# Patient Record
Sex: Male | Born: 1955 | Race: White | Hispanic: No | Marital: Married | Smoking: Former smoker
Health system: Southern US, Community
[De-identification: ages and names within clinical notes are randomized; demographics above are authoritative.]

## PROBLEM LIST (undated history)

## (undated) DIAGNOSIS — T7840XA Allergy, unspecified, initial encounter: Secondary | ICD-10-CM

## (undated) DIAGNOSIS — N400 Enlarged prostate without lower urinary tract symptoms: Secondary | ICD-10-CM

## (undated) HISTORY — DX: Allergy, unspecified, initial encounter: T78.40XA

## (undated) HISTORY — PX: NASAL SEPTUM SURGERY: SHX37

## (undated) HISTORY — DX: Benign prostatic hyperplasia without lower urinary tract symptoms: N40.0

---

## 1999-10-14 ENCOUNTER — Encounter: Payer: Self-pay | Admitting: Internal Medicine

## 1999-10-14 ENCOUNTER — Ambulatory Visit (HOSPITAL_COMMUNITY): Admission: RE | Admit: 1999-10-14 | Discharge: 1999-10-14 | Payer: Self-pay | Admitting: Internal Medicine

## 1999-11-18 ENCOUNTER — Other Ambulatory Visit: Admission: RE | Admit: 1999-11-18 | Discharge: 1999-11-18 | Payer: Self-pay | Admitting: Gastroenterology

## 1999-11-18 ENCOUNTER — Encounter (INDEPENDENT_AMBULATORY_CARE_PROVIDER_SITE_OTHER): Payer: Self-pay | Admitting: Specialist

## 2004-03-04 ENCOUNTER — Ambulatory Visit: Payer: Self-pay | Admitting: Internal Medicine

## 2004-03-12 ENCOUNTER — Ambulatory Visit: Payer: Self-pay | Admitting: Internal Medicine

## 2005-06-02 ENCOUNTER — Ambulatory Visit: Payer: Self-pay | Admitting: Internal Medicine

## 2007-01-17 ENCOUNTER — Ambulatory Visit: Payer: Self-pay | Admitting: Internal Medicine

## 2007-01-17 LAB — CONVERTED CEMR LAB
ALT: 25 units/L (ref 0–53)
AST: 21 units/L (ref 0–37)
Albumin: 3.9 g/dL (ref 3.5–5.2)
Alkaline Phosphatase: 53 units/L (ref 39–117)
BUN: 12 mg/dL (ref 6–23)
Basophils Absolute: 0 10*3/uL (ref 0.0–0.1)
Basophils Relative: 0.4 % (ref 0.0–1.0)
Bilirubin Urine: NEGATIVE
Bilirubin, Direct: 0.2 mg/dL (ref 0.0–0.3)
CO2: 31 meq/L (ref 19–32)
Calcium: 9.3 mg/dL (ref 8.4–10.5)
Chloride: 105 meq/L (ref 96–112)
Cholesterol: 169 mg/dL (ref 0–200)
Creatinine, Ser: 1 mg/dL (ref 0.4–1.5)
Eosinophils Absolute: 0.2 10*3/uL (ref 0.0–0.6)
Eosinophils Relative: 2.5 % (ref 0.0–5.0)
GFR calc Af Amer: 102 mL/min
GFR calc non Af Amer: 84 mL/min
Glucose, Bld: 94 mg/dL (ref 70–99)
Glucose, Urine, Semiquant: NEGATIVE
HCT: 41 % (ref 39.0–52.0)
HDL: 29.3 mg/dL — ABNORMAL LOW (ref >39.0)
Hemoglobin: 14.5 g/dL (ref 13.0–17.0)
Ketones, urine, test strip: NEGATIVE
LDL Cholesterol: 112 mg/dL — ABNORMAL HIGH (ref 0–99)
Lymphocytes Relative: 23.3 % (ref 12.0–46.0)
MCHC: 35.3 g/dL (ref 30.0–36.0)
MCV: 93.3 fL (ref 78.0–100.0)
Monocytes Absolute: 0.4 10*3/uL (ref 0.2–0.7)
Monocytes Relative: 6.2 % (ref 3.0–11.0)
Neutro Abs: 4.8 10*3/uL (ref 1.4–7.7)
Neutrophils Relative %: 67.6 % (ref 43.0–77.0)
Nitrite: NEGATIVE
PSA: 0.35 ng/mL (ref 0.10–4.00)
Platelets: 206 10*3/uL (ref 150–400)
Potassium: 5 meq/L (ref 3.5–5.1)
Protein, U semiquant: NEGATIVE
RBC: 4.39 M/uL (ref 4.22–5.81)
RDW: 11.7 % (ref 11.5–14.6)
Sodium: 141 meq/L (ref 135–145)
Specific Gravity, Urine: 1.025
TSH: 1.19 microintl units/mL (ref 0.35–5.50)
Total Bilirubin: 0.8 mg/dL (ref 0.3–1.2)
Total CHOL/HDL Ratio: 5.8
Total Protein: 6.4 g/dL (ref 6.0–8.3)
Triglycerides: 140 mg/dL (ref 0–149)
Urobilinogen, UA: 0.2
VLDL: 28 mg/dL (ref 0–40)
WBC Urine, dipstick: NEGATIVE
WBC: 7.1 10*3/uL (ref 4.5–10.5)
pH: 5.5

## 2007-02-08 ENCOUNTER — Ambulatory Visit: Payer: Self-pay | Admitting: Internal Medicine

## 2007-03-08 ENCOUNTER — Telehealth: Payer: Self-pay | Admitting: Internal Medicine

## 2007-11-13 ENCOUNTER — Ambulatory Visit: Payer: Self-pay | Admitting: Gastroenterology

## 2007-11-30 ENCOUNTER — Ambulatory Visit: Payer: Self-pay | Admitting: Gastroenterology

## 2007-11-30 ENCOUNTER — Encounter: Payer: Self-pay | Admitting: Gastroenterology

## 2007-11-30 HISTORY — PX: COLONOSCOPY: SHX174

## 2007-12-03 ENCOUNTER — Encounter: Payer: Self-pay | Admitting: Gastroenterology

## 2009-03-05 ENCOUNTER — Ambulatory Visit: Payer: Self-pay | Admitting: Internal Medicine

## 2009-03-05 LAB — CONVERTED CEMR LAB
ALT: 29 units/L (ref 0–53)
AST: 26 units/L (ref 0–37)
Albumin: 4.3 g/dL (ref 3.5–5.2)
Alkaline Phosphatase: 57 units/L (ref 39–117)
BUN: 14 mg/dL (ref 6–23)
Basophils Absolute: 0 10*3/uL (ref 0.0–0.1)
Basophils Relative: 0.8 % (ref 0.0–3.0)
Bilirubin Urine: NEGATIVE
Bilirubin, Direct: 0.2 mg/dL (ref 0.0–0.3)
CO2: 30 meq/L (ref 19–32)
Calcium: 9.5 mg/dL (ref 8.4–10.5)
Chloride: 108 meq/L (ref 96–112)
Cholesterol: 189 mg/dL (ref 0–200)
Creatinine, Ser: 1 mg/dL (ref 0.4–1.5)
Eosinophils Absolute: 0.1 10*3/uL (ref 0.0–0.7)
Eosinophils Relative: 2.9 % (ref 0.0–5.0)
GFR calc non Af Amer: 83.1 mL/min (ref >60)
Glucose, Bld: 96 mg/dL (ref 70–99)
HCT: 42.9 % (ref 39.0–52.0)
HDL: 40.4 mg/dL (ref >39.00)
Hemoglobin: 14.2 g/dL (ref 13.0–17.0)
Ketones, ur: NEGATIVE mg/dL
LDL Cholesterol: 121 mg/dL — ABNORMAL HIGH (ref 0–99)
Leukocytes, UA: NEGATIVE
Lymphocytes Relative: 28.5 % (ref 12.0–46.0)
Lymphs Abs: 1.2 10*3/uL (ref 0.7–4.0)
MCHC: 33.1 g/dL (ref 30.0–36.0)
MCV: 98.3 fL (ref 78.0–100.0)
Monocytes Absolute: 0.3 10*3/uL (ref 0.1–1.0)
Monocytes Relative: 7.5 % (ref 3.0–12.0)
Neutro Abs: 2.7 10*3/uL (ref 1.4–7.7)
Neutrophils Relative %: 60.3 % (ref 43.0–77.0)
Nitrite: NEGATIVE
PSA: 0.51 ng/mL (ref 0.10–4.00)
Platelets: 194 10*3/uL (ref 150.0–400.0)
Potassium: 4.9 meq/L (ref 3.5–5.1)
RBC: 4.37 M/uL (ref 4.22–5.81)
RDW: 12 % (ref 11.5–14.6)
Sodium: 143 meq/L (ref 135–145)
Specific Gravity, Urine: 1.02 (ref 1.000–1.030)
TSH: 1.56 microintl units/mL (ref 0.35–5.50)
Total Bilirubin: 0.8 mg/dL (ref 0.3–1.2)
Total CHOL/HDL Ratio: 5
Total Protein, Urine: NEGATIVE mg/dL
Total Protein: 7.2 g/dL (ref 6.0–8.3)
Triglycerides: 139 mg/dL (ref 0.0–149.0)
Urine Glucose: NEGATIVE mg/dL
Urobilinogen, UA: 0.2 (ref 0.0–1.0)
VLDL: 27.8 mg/dL (ref 0.0–40.0)
WBC: 4.3 10*3/uL — ABNORMAL LOW (ref 4.5–10.5)
pH: 6 (ref 5.0–8.0)

## 2009-03-18 ENCOUNTER — Ambulatory Visit: Payer: Self-pay | Admitting: Internal Medicine

## 2010-03-02 NOTE — Assessment & Plan Note (Signed)
Summary: cpx/cjr   Vital Signs:  Patient profile:   55 year old male Height:      70 inches Weight:      194 pounds BMI:     27.94 Pulse rate:   60 / minute Resp:     12 per minute BP sitting:   118 / 82  (left arm)  Vitals Entered By: Gladis Riffle, RN (March 18, 2009 8:08 AM)  Nutrition Counseling: Patient's BMI is greater than 25 and therefore counseled on weight management options. CC: cpx, labs done Is Patient Diabetic? No   CC:  cpx and labs done.  History of Present Illness: cpx  Preventive Screening-Counseling & Management  Alcohol-Tobacco     Smoking Status: quit > 6 months     Year Quit: 2003  Caffeine-Diet-Exercise     Does Patient Exercise: yes     Type of exercise: aerobic  Current Problems (verified): 1)  Physical Examination  (ICD-V70.0)  Current Medications (verified): 1)  Ots Sleep Aid .... At Bedtime 2)  Advil 200 Mg Tabs (Ibuprofen) .... As Needed  Allergies (verified): No Known Drug Allergies  Social History: Smoking Status:  quit > 6 months Does Patient Exercise:  yes  Review of Systems       All other systems reviewed and were negative   Physical Exam  General:  alert and well-developed.   Head:  normocephalic and atraumatic.   Eyes:  pupils equal and pupils round.   Ears:  R ear normal and L ear normal.   Neck:  No deformities, masses, or tenderness noted. Chest Wall:  No deformities, masses, tenderness or gynecomastia noted. Lungs:  Normal respiratory effort, chest expands symmetrically. Lungs are clear to auscultation, no crackles or wheezes. Heart:  Normal rate and regular rhythm. S1 and S2 normal without gallop, murmur, click, rub or other extra sounds. Abdomen:  Bowel sounds positive,abdomen soft and non-tender without masses, organomegaly or hernias noted. Rectal:  no external abnormalities and normal sphincter tone.   Prostate:  no gland enlargement and no asymmetry.   Msk:  No deformity or scoliosis noted of thoracic or  lumbar spine.   Pulses:  R radial normal and L radial normal.   Neurologic:  cranial nerves II-XII intact and gait normal.     Impression & Recommendations:  Problem # 1:  PHYSICAL EXAMINATION (ICD-V70.0) health maint UTD advised daily exercise low fat diet  Complete Medication List: 1)  Ots Sleep Aid  .... At bedtime 2)  Advil 200 Mg Tabs (Ibuprofen) .... As needed

## 2010-09-29 ENCOUNTER — Telehealth: Payer: Self-pay | Admitting: Internal Medicine

## 2010-09-29 DIAGNOSIS — Z0000 Encounter for general adult medical examination without abnormal findings: Secondary | ICD-10-CM

## 2010-09-29 NOTE — Telephone Encounter (Signed)
Pt called and has sch cpx labs at [facility]. Need to get lab order for that location.

## 2010-09-29 NOTE — Telephone Encounter (Signed)
Future orders placed 

## 2010-11-22 ENCOUNTER — Other Ambulatory Visit: Payer: Self-pay

## 2010-11-22 DIAGNOSIS — Z0000 Encounter for general adult medical examination without abnormal findings: Secondary | ICD-10-CM

## 2010-11-22 LAB — CBC WITH DIFFERENTIAL/PLATELET
Basophils Absolute: 0 10*3/uL (ref 0.0–0.1)
Eosinophils Relative: 3 % (ref 0.0–5.0)
HCT: 42 % (ref 39.0–52.0)
Lymphs Abs: 1.4 10*3/uL (ref 0.7–4.0)
Monocytes Relative: 6.4 % (ref 3.0–12.0)
Neutrophils Relative %: 64.6 % (ref 43.0–77.0)
Platelets: 200 10*3/uL (ref 150.0–400.0)
RDW: 12.7 % (ref 11.5–14.6)
WBC: 5.4 10*3/uL (ref 4.5–10.5)

## 2010-11-22 LAB — HEPATIC FUNCTION PANEL
ALT: 18 U/L (ref 0–53)
AST: 20 U/L (ref 0–37)
Albumin: 4.4 g/dL (ref 3.5–5.2)
Alkaline Phosphatase: 53 U/L (ref 39–117)
Total Protein: 7 g/dL (ref 6.0–8.3)

## 2010-11-22 LAB — BASIC METABOLIC PANEL
BUN: 19 mg/dL (ref 6–23)
Calcium: 8.9 mg/dL (ref 8.4–10.5)
Chloride: 104 mEq/L (ref 96–112)
Creatinine, Ser: 1 mg/dL (ref 0.4–1.5)
GFR: 84.52 mL/min (ref >60.00)

## 2010-11-22 LAB — LIPID PANEL
Cholesterol: 191 mg/dL (ref 0–200)
HDL: 42.8 mg/dL (ref >39.00)
Total CHOL/HDL Ratio: 4
Triglycerides: 142 mg/dL (ref 0.0–149.0)

## 2010-11-22 LAB — TSH: TSH: 1.66 u[IU]/mL (ref 0.35–5.50)

## 2010-11-22 LAB — PSA: PSA: 1.53 ng/mL (ref 0.10–4.00)

## 2010-11-29 ENCOUNTER — Ambulatory Visit (INDEPENDENT_AMBULATORY_CARE_PROVIDER_SITE_OTHER): Payer: BC Managed Care – PPO | Admitting: Internal Medicine

## 2010-11-29 ENCOUNTER — Encounter: Payer: Self-pay | Admitting: Internal Medicine

## 2010-11-29 VITALS — BP 126/84 | HR 68 | Temp 98.1°F | Ht 70.5 in | Wt 189.0 lb

## 2010-11-29 DIAGNOSIS — Z23 Encounter for immunization: Secondary | ICD-10-CM

## 2010-11-29 DIAGNOSIS — R972 Elevated prostate specific antigen [PSA]: Secondary | ICD-10-CM

## 2010-11-29 DIAGNOSIS — Z0000 Encounter for general adult medical examination without abnormal findings: Secondary | ICD-10-CM

## 2010-11-29 LAB — POCT URINALYSIS DIPSTICK
Glucose, UA: NEGATIVE
Ketones, UA: NEGATIVE
Leukocytes, UA: NEGATIVE
Spec Grav, UA: 1.025
Urobilinogen, UA: 0.2

## 2010-11-29 NOTE — Progress Notes (Signed)
  Subjective:    Patient ID: Todd [name], male    DOB: December 12, 1955, 55 y.o.   MRN: [id number]  HPI cpx  Past Medical History  Diagnosis Date  . Allergy   . BPH (benign prostatic hyperplasia)    Past Surgical History  Procedure Date  . Nasal septum surgery     reports that he quit smoking about 9 years ago. He does not have any smokeless tobacco history on file. He reports that he drinks about .6 ounces of alcohol per week. His drug history not on file. family history includes Cancer (age of onset:77) in his father. No Known Allergies   Review of Systems  patient denies chest pain, shortness of breath, orthopnea. Denies lower extremity edema, abdominal pain, change in appetite, change in bowel movements. Patient denies rashes, musculoskeletal complaints. No other specific complaints in a complete review of systems.      Objective:   Physical Exam Well-developed male in no acute distress. HEENT exam atraumatic, normocephalic, extraocular muscles are intact. Conjunctivae are pink without exudate. Neck is supple without lymphadenopathy, thyromegaly, jugular venous distention. Chest is clear to auscultation without increased work of breathing. Cardiac exam S1-S2 are regular. The PMI is normal. No significant murmurs or gallops. Abdominal exam active bowel sounds, soft, nontender. No abdominal bruits. Extremities no clubbing cyanosis or edema. Peripheral pulses are normal without bruits. Neurologic exam alert and oriented without any motor or sensory deficits. Rectal exam normal tone prostate normal size without masses or asymmetry.        Assessment & Plan:  Well visit. Health maintenance up-to-date.

## 2011-01-04 ENCOUNTER — Other Ambulatory Visit (INDEPENDENT_AMBULATORY_CARE_PROVIDER_SITE_OTHER): Payer: BC Managed Care – PPO

## 2011-01-04 DIAGNOSIS — IMO0001 Reserved for inherently not codable concepts without codable children: Secondary | ICD-10-CM

## 2011-01-04 LAB — POCT URINALYSIS DIPSTICK
Ketones, UA: NEGATIVE
Leukocytes, UA: NEGATIVE
Nitrite, UA: NEGATIVE
Protein, UA: NEGATIVE
Urobilinogen, UA: 0.2

## 2011-01-04 LAB — MICROALBUMIN / CREATININE URINE RATIO: Microalb, Ur: 0.2 mg/dL (ref 0.0–1.9)

## 2011-01-13 ENCOUNTER — Telehealth: Payer: Self-pay | Admitting: Internal Medicine

## 2011-01-13 NOTE — Telephone Encounter (Signed)
Pt need ua results

## 2011-01-14 NOTE — Telephone Encounter (Signed)
Pt aware of normal u/a

## 2011-05-30 ENCOUNTER — Other Ambulatory Visit (INDEPENDENT_AMBULATORY_CARE_PROVIDER_SITE_OTHER): Payer: BC Managed Care – PPO

## 2011-05-30 DIAGNOSIS — R972 Elevated prostate specific antigen [PSA]: Secondary | ICD-10-CM

## 2011-05-30 LAB — PSA: PSA: 0.43 ng/mL (ref 0.10–4.00)

## 2012-09-13 ENCOUNTER — Encounter: Payer: Self-pay | Admitting: Gastroenterology

## 2012-09-17 ENCOUNTER — Encounter: Payer: Self-pay | Admitting: Gastroenterology

## 2012-11-27 ENCOUNTER — Encounter: Payer: Self-pay | Admitting: Gastroenterology

## 2012-11-27 ENCOUNTER — Ambulatory Visit (AMBULATORY_SURGERY_CENTER): Payer: Self-pay | Admitting: *Deleted

## 2012-11-27 VITALS — Ht 70.0 in | Wt 190.4 lb

## 2012-11-27 DIAGNOSIS — Z86010 Personal history of colonic polyps: Secondary | ICD-10-CM

## 2012-11-27 MED ORDER — SOD PICOSULFATE-MAG OX-CIT ACD 10-3.5-12 MG-GM-GM PO PACK: 1.0000 | PACK | Freq: Once | ORAL | Status: DC

## 2012-11-27 NOTE — Progress Notes (Signed)
Denies allergies to eggs or soy products. Denies complications with sedation or anesthesia. 

## 2012-12-11 ENCOUNTER — Encounter: Payer: Self-pay | Admitting: Gastroenterology

## 2012-12-11 ENCOUNTER — Encounter: Payer: BC Managed Care – PPO | Admitting: Gastroenterology

## 2012-12-11 ENCOUNTER — Ambulatory Visit (AMBULATORY_SURGERY_CENTER): Payer: BC Managed Care – PPO | Admitting: Gastroenterology

## 2012-12-11 VITALS — BP 127/66 | HR 51 | Temp 97.3°F | Resp 17 | Ht 70.0 in | Wt 190.0 lb

## 2012-12-11 DIAGNOSIS — Z86010 Personal history of colonic polyps: Secondary | ICD-10-CM

## 2012-12-11 DIAGNOSIS — Z800 Family history of malignant neoplasm of digestive organs: Secondary | ICD-10-CM

## 2012-12-11 MED ORDER — SODIUM CHLORIDE 0.9 % IV SOLN: 500.0000 mL | INTRAVENOUS | Status: DC

## 2012-12-11 NOTE — Patient Instructions (Signed)
YOU HAD AN ENDOSCOPIC PROCEDURE TODAY AT THE [facility]: Refer to the procedure report that was given to you for any specific questions about what was found during the examination.  If the procedure report does not answer your questions, please call your gastroenterologist to clarify.  If you requested that your care partner not be given the details of your procedure findings, then the procedure report has been included in a sealed envelope for you to review at your convenience later.  YOU SHOULD EXPECT: Some feelings of bloating in the abdomen. Passage of more gas than usual.  Walking can help get rid of the air that was put into your GI tract during the procedure and reduce the bloating. If you had a lower endoscopy (such as a colonoscopy or flexible sigmoidoscopy) you may notice spotting of blood in your stool or on the toilet paper. If you underwent a bowel prep for your procedure, then you may not have a normal bowel movement for a few days.  DIET: Your first meal following the procedure should be a light meal and then it is ok to progress to your normal diet.  A half-sandwich or bowl of soup is an example of a good first meal.  Heavy or fried foods are harder to digest and may make you feel nauseous or bloated.  Likewise meals heavy in dairy and vegetables can cause extra gas to form and this can also increase the bloating.  Drink plenty of fluids but you should avoid alcoholic beverages for 24 hours.  ACTIVITY: Your care partner should take you home directly after the procedure.  You should plan to take it easy, moving slowly for the rest of the day.  You can resume normal activity the day after the procedure however you should NOT DRIVE or use heavy machinery for 24 hours (because of the sedation medicines used during the test).    SYMPTOMS TO REPORT IMMEDIATELY: A gastroenterologist can be reached at any hour.  During normal business hours, 8:30 AM to 5:00 PM Monday through Friday,  call [phone].  After hours and on weekends, please call the GI answering service at [phone] who will take a message and have the physician on call contact you.   Following lower endoscopy (colonoscopy or flexible sigmoidoscopy):  Excessive amounts of blood in the stool  Significant tenderness or worsening of abdominal pains  Swelling of the abdomen that is new, acute  Fever of 100F or higher    FOLLOW UP: If any biopsies were taken you will be contacted by phone or by letter within the next 1-3 weeks.  Call your gastroenterologist if you have not heard about the biopsies in 3 weeks.  Our staff will call the home number listed on your records the next business day following your procedure to check on you and address any questions or concerns that you may have at that time regarding the information given to you following your procedure. This is a courtesy call and so if there is no answer at the home number and we have not heard from you through the emergency physician on call, we will assume that you have returned to your regular daily activities without incident.  SIGNATURES/CONFIDENTIALITY: You and/or your care partner have signed paperwork which will be entered into your electronic medical record.  These signatures attest to the fact that that the information above on your After Visit Summary has been reviewed and is understood.  Full responsibility of the confidentiality   of this discharge information lies with you and/or your care-partner.   Hemorrhoid information given.  Repeat colonoscopy in 5 years.

## 2012-12-11 NOTE — Op Note (Signed)
[facility] [address] [place], [address]   COLONOSCOPY PROCEDURE REPORT  PATIENT: Todd, [name]  MR#: [id number] BIRTHDATE: 01-27-1956 , 57  yrs. old GENDER: Male ENDOSCOPIST: Meryl Dare, MD, [facility] PROCEDURE DATE:  12/11/2012 PROCEDURE:   Colonoscopy, screening First Screening Colonoscopy - Avg.  risk and is 50 yrs.  old or older - No.  Prior Negative Screening - Now for repeat screening. N/A  History of Adenoma - Now for follow-up colonoscopy & has been > or = to 3 yrs.  Yes hx of adenoma.  Has been 3 or more years since last colonoscopy.  Polyps Removed Today? No.  Recommend repeat exam, <10 yrs? Yes.  High risk (family or personal hx). ASA CLASS:   Class II INDICATIONS:Patient's personal history of adenomatous colon polyps and Patient's immediate family history of colon cancer. MEDICATIONS: MAC sedation, administered by CRNA and propofol (Diprivan) 300mg  IV DESCRIPTION OF PROCEDURE:   After the risks benefits and alternatives of the procedure were thoroughly explained, informed consent was obtained.  A digital rectal exam revealed no abnormalities of the rectum.   The LB [id number]  endoscope was introduced through the anus and advanced to the cecum, which was identified by both the appendix and ileocecal valve. No adverse events experienced.   The quality of the prep was Prepopik excellent  The instrument was then slowly withdrawn as the colon was fully examined.  COLON FINDINGS: A normal appearing cecum, ileocecal valve, and appendiceal orifice were identified.  The ascending, hepatic flexure, transverse, splenic flexure, descending, sigmoid colon and rectum appeared unremarkable.  No polyps or cancers were seen. Retroflexed views revealed small internal hemorrhoids. The time to cecum=2 minutes 20 seconds.  Withdrawal time=9 minutes 20 seconds. The scope was withdrawn and the procedure completed.  COMPLICATIONS: There were no  complications.  ENDOSCOPIC IMPRESSION: 1.  Normal colon 2.  Small internal hemorrhoids  RECOMMENDATIONS: 1.  Repeat Colonoscopy in 5 years.   eSigned:  Meryl Dare, MD, [facility] 12/11/2012 9:51 AM

## 2012-12-11 NOTE — Progress Notes (Signed)
Patient did not experience any of the following events: a burn prior to discharge; a fall within the facility; wrong site/side/patient/procedure/implant event; or a hospital transfer or hospital admission upon discharge from the facility. (G8907) Patient did not have preoperative order for IV antibiotic SSI prophylaxis. (G8918)  

## 2012-12-11 NOTE — Progress Notes (Signed)
Lidocaine-40mg IV prior to Propofol InductionPropofol given over incremental dosages 

## 2012-12-12 ENCOUNTER — Telehealth: Payer: Self-pay | Admitting: *Deleted

## 2012-12-12 NOTE — Telephone Encounter (Signed)
Message left

## 2013-05-17 ENCOUNTER — Other Ambulatory Visit (INDEPENDENT_AMBULATORY_CARE_PROVIDER_SITE_OTHER): Payer: BC Managed Care – PPO

## 2013-05-17 DIAGNOSIS — Z0000 Encounter for general adult medical examination without abnormal findings: Secondary | ICD-10-CM

## 2013-05-17 LAB — LIPID PANEL
Cholesterol: 185 mg/dL (ref 0–200)
HDL: 39.9 mg/dL (ref >39.00)
LDL CALC: 120 mg/dL — AB (ref 0–99)
TRIGLYCERIDES: 124 mg/dL (ref 0.0–149.0)
Total CHOL/HDL Ratio: 5
VLDL: 24.8 mg/dL (ref 0.0–40.0)

## 2013-05-17 LAB — CBC WITH DIFFERENTIAL/PLATELET
Basophils Absolute: 0 10*3/uL (ref 0.0–0.1)
Basophils Relative: 0.9 % (ref 0.0–3.0)
EOS ABS: 0.1 10*3/uL (ref 0.0–0.7)
Eosinophils Relative: 2.8 % (ref 0.0–5.0)
HCT: 41.3 % (ref 39.0–52.0)
Hemoglobin: 14.1 g/dL (ref 13.0–17.0)
Lymphocytes Relative: 26 % (ref 12.0–46.0)
Lymphs Abs: 1.4 10*3/uL (ref 0.7–4.0)
MCHC: 34.1 g/dL (ref 30.0–36.0)
MCV: 96.8 fl (ref 78.0–100.0)
Monocytes Absolute: 0.4 10*3/uL (ref 0.1–1.0)
Monocytes Relative: 7.8 % (ref 3.0–12.0)
NEUTROS PCT: 62.5 % (ref 43.0–77.0)
Neutro Abs: 3.3 10*3/uL (ref 1.4–7.7)
PLATELETS: 205 10*3/uL (ref 150.0–400.0)
RBC: 4.27 Mil/uL (ref 4.22–5.81)
RDW: 12.8 % (ref 11.5–14.6)
WBC: 5.3 10*3/uL (ref 4.5–10.5)

## 2013-05-17 LAB — POCT URINALYSIS DIPSTICK
Bilirubin, UA: NEGATIVE
Blood, UA: NEGATIVE
Glucose, UA: NEGATIVE
Ketones, UA: NEGATIVE
Leukocytes, UA: NEGATIVE
Nitrite, UA: NEGATIVE
Protein, UA: NEGATIVE
Spec Grav, UA: 1.015
Urobilinogen, UA: 0.2
pH, UA: 7

## 2013-05-17 LAB — HEPATIC FUNCTION PANEL
ALT: 25 U/L (ref 0–53)
AST: 22 U/L (ref 0–37)
Albumin: 4.2 g/dL (ref 3.5–5.2)
Alkaline Phosphatase: 54 U/L (ref 39–117)
BILIRUBIN DIRECT: 0.1 mg/dL (ref 0.0–0.3)
BILIRUBIN TOTAL: 1.3 mg/dL — AB (ref 0.3–1.2)
Total Protein: 6.8 g/dL (ref 6.0–8.3)

## 2013-05-17 LAB — PSA: PSA: 0.66 ng/mL (ref 0.10–4.00)

## 2013-05-17 LAB — BASIC METABOLIC PANEL
BUN: 16 mg/dL (ref 6–23)
CALCIUM: 9.6 mg/dL (ref 8.4–10.5)
CO2: 30 meq/L (ref 19–32)
CREATININE: 1 mg/dL (ref 0.4–1.5)
Chloride: 104 mEq/L (ref 96–112)
GFR: 85.77 mL/min (ref >60.00)
Glucose, Bld: 92 mg/dL (ref 70–99)
Potassium: 5.4 mEq/L — ABNORMAL HIGH (ref 3.5–5.1)
Sodium: 141 mEq/L (ref 135–145)

## 2013-05-17 LAB — TSH: TSH: 1.77 u[IU]/mL (ref 0.35–5.50)

## 2013-05-24 ENCOUNTER — Encounter: Payer: BC Managed Care – PPO | Admitting: Internal Medicine

## 2013-05-26 NOTE — Progress Notes (Signed)
cpx  Past Medical History  Diagnosis Date  . Allergy   . BPH (benign prostatic hyperplasia)     History   Social History  . Marital Status: Married    Spouse Name: N/A    Number of Children: N/A  . Years of Education: N/A   Occupational History  . Not on file.   Social History Main Topics  . Smoking status: Former Smoker    Quit date: 01/31/2001  . Smokeless tobacco: Never Used  . Alcohol Use: 6.0 oz/week    10 Cans of beer per week  . Drug Use: No  . Sexual Activity: Not on file   Other Topics Concern  . Not on file   Social History Narrative  . No narrative on file    Past Surgical History  Procedure Laterality Date  . Nasal septum surgery    . Colonoscopy  11/30/07    Family History  Problem Relation Age of Onset  . Cancer Father 19    cancer  . Colon cancer Father   . Colon cancer Paternal Grandmother   . Esophageal cancer Neg Hx   . Rectal cancer Neg Hx   . Stomach cancer Neg Hx     No Known Allergies  Current Outpatient Prescriptions on File Prior to Visit  Medication Sig Dispense Refill  . ibuprofen (ADVIL,MOTRIN) 200 MG tablet Take 200 mg by mouth every 6 (six) hours as needed.        . Multiple Vitamins-Minerals (CENTRUM SILVER ULTRA MENS PO) Take 1 tablet by mouth daily.       No current facility-administered medications on file prior to visit.     patient denies chest pain, shortness of breath, orthopnea. Denies lower extremity edema, abdominal pain, change in appetite, change in bowel movements. Patient denies rashes, musculoskeletal complaints. No other specific complaints in a complete review of systems.   Reviewed vitals Well-developed male in no acute distress. HEENT exam atraumatic, normocephalic, extraocular muscles are intact. Conjunctivae are pink without exudate. Neck is supple without lymphadenopathy, thyromegaly, jugular venous distention. Chest is clear to auscultation without increased work of breathing. Cardiac exam S1-S2 are  regular. The PMI is normal. No significant murmurs or gallops. Abdominal exam active bowel sounds, soft, nontender. No abdominal bruits. Extremities no clubbing cyanosis or edema. Peripheral pulses are normal without bruits. Neurologic exam alert and oriented without any motor or sensory deficits. Rectal exam normal tone prostate normal size without masses or asymmetry.  Well Visit- health maint UTD Has occasional trouble sleeping. He really describes early awakenings. Admits to "thinking a lot".

## 2013-05-27 ENCOUNTER — Encounter: Payer: Self-pay | Admitting: Internal Medicine

## 2013-05-27 ENCOUNTER — Ambulatory Visit (INDEPENDENT_AMBULATORY_CARE_PROVIDER_SITE_OTHER): Payer: BC Managed Care – PPO | Admitting: Internal Medicine

## 2013-05-27 VITALS — BP 146/72 | HR 76 | Temp 98.0°F | Ht 71.0 in | Wt 190.0 lb

## 2013-05-27 DIAGNOSIS — Z0000 Encounter for general adult medical examination without abnormal findings: Secondary | ICD-10-CM

## 2013-05-27 NOTE — Progress Notes (Signed)
Pre visit review using our clinic review tool, if applicable. No additional management support is needed unless otherwise documented below in the visit note. 

## 2013-05-27 NOTE — Patient Instructions (Signed)
Consider Saw Palmetto 160 mg daily--might help with urinary frequency.

## 2014-02-11 ENCOUNTER — Encounter: Payer: Self-pay | Admitting: Gastroenterology

## 2015-01-01 ENCOUNTER — Encounter: Payer: Self-pay | Admitting: Gastroenterology

## 2015-03-04 ENCOUNTER — Encounter: Payer: Self-pay | Admitting: Gastroenterology

## 2015-03-04 ENCOUNTER — Ambulatory Visit (INDEPENDENT_AMBULATORY_CARE_PROVIDER_SITE_OTHER): Payer: 59 | Admitting: Gastroenterology

## 2015-03-04 VITALS — BP 160/80 | HR 72 | Ht 70.0 in | Wt 188.2 lb

## 2015-03-04 DIAGNOSIS — Z800 Family history of malignant neoplasm of digestive organs: Secondary | ICD-10-CM

## 2015-03-04 DIAGNOSIS — Z86010 Personal history of colonic polyps: Secondary | ICD-10-CM | POA: Diagnosis not present

## 2015-03-04 DIAGNOSIS — R195 Other fecal abnormalities: Secondary | ICD-10-CM

## 2015-03-04 DIAGNOSIS — Z860101 Personal history of adenomatous and serrated colon polyps: Secondary | ICD-10-CM

## 2015-03-04 NOTE — Patient Instructions (Addendum)
Start over the [employer] daily.   Increase your water intake.   You will be due for a recall colonoscopy in 12/2017. We will send you a reminder in the mail when it gets closer to that time.   High-Fiber Diet Fiber, also called dietary fiber, is a type of carbohydrate found in fruits, vegetables, whole grains, and beans. A high-fiber diet can have many health benefits. Your health care provider may recommend a high-fiber diet to help:  Prevent constipation. Fiber can make your bowel movements more regular.  Lower your cholesterol.  Relieve hemorrhoids, uncomplicated diverticulosis, or irritable bowel syndrome.  Prevent overeating as part of a weight-loss plan.  Prevent heart disease, type 2 diabetes, and certain cancers. WHAT IS MY PLAN? The recommended daily intake of fiber includes:  38 grams for men under age 41.  69 grams for men over age 21.  54 grams for women under age 6.  22 grams for women over age 56. You can get the recommended daily intake of dietary fiber by eating a variety of fruits, vegetables, grains, and beans. Your health care provider may also recommend a fiber supplement if it is not possible to get enough fiber through your diet. WHAT DO I NEED TO KNOW ABOUT A HIGH-FIBER DIET?  Fiber supplements have not been widely studied for their effectiveness, so it is better to get fiber through food sources.  Always check the fiber content on thenutrition facts label of any prepackaged food. Look for foods that contain at least 5 grams of fiber per serving.  Ask your dietitian if you have questions about specific foods that are related to your condition, especially if those foods are not listed in the following section.  Increase your daily fiber consumption gradually. Increasing your intake of dietary fiber too quickly may cause bloating, cramping, or gas.  Drink plenty of water. Water helps you to digest fiber. WHAT FOODS CAN I  EAT? Grains Whole-grain breads. Multigrain cereal. Oats and oatmeal. Brown rice. Barley. Bulgur wheat. Avon. Bran muffins. Popcorn. Rye wafer crackers. Vegetables Sweet potatoes. Spinach. Kale. Artichokes. Cabbage. Broccoli. Green peas. Carrots. Squash. Fruits Berries. Pears. Apples. Oranges. Avocados. Prunes and raisins. Dried figs. Meats and Other Protein Sources Navy, kidney, pinto, and soy beans. Split peas. Lentils. Nuts and seeds. Dairy Fiber-fortified yogurt. Beverages Fiber-fortified soy milk. Fiber-fortified orange juice. Other Fiber bars. The items listed above may not be a complete list of recommended foods or beverages. Contact your dietitian for more options. WHAT FOODS ARE NOT RECOMMENDED? Grains White bread. Pasta made with refined flour. White rice. Vegetables Fried potatoes. Canned vegetables. Well-cooked vegetables.  Fruits Fruit juice. Cooked, strained fruit. Meats and Other Protein Sources Fatty cuts of meat. Fried Sales executive or fried fish. Dairy Milk. Yogurt. Cream cheese. Sour cream. Beverages Soft drinks. Other Cakes and pastries. Butter and oils. The items listed above may not be a complete list of foods and beverages to avoid. Contact your dietitian for more information. WHAT ARE SOME TIPS FOR INCLUDING HIGH-FIBER FOODS IN MY DIET?  Eat a wide variety of high-fiber foods.  Make sure that half of all grains consumed each day are whole grains.  Replace breads and cereals made from refined flour or white flour with whole-grain breads and cereals.  Replace white rice with brown rice, bulgur wheat, or millet.  Start the day with a breakfast that is high in fiber, such as a cereal that contains at least 5 grams of fiber per serving.  Use beans in  place of meat in soups, salads, or pasta.  Eat high-fiber snacks, such as berries, raw vegetables, nuts, or popcorn.   This information is not intended to replace advice given to you by your health care  provider. Make sure you discuss any questions you have with your health care provider.   Document Released: 01/17/2005 Document Revised: 02/07/2014 Document Reviewed: 07/02/2013 Elsevier Interactive Patient Education 2016 [place] you for choosing me and [facility].  Pricilla Riffle. Dagoberto Ligas., MD., Marval Regal  cc: Velna Hatchet, MD

## 2015-03-04 NOTE — Progress Notes (Signed)
    History of Present Illness: This is a 60 year old male with occult blood in stool. He is followed by Dr. Ardeth Perfect had a routine iFOB performed at a physical exam which was positive. Pt underwent colonoscopy in November 2014 for a personal history of adenomatous colon polyps and a family history of colon cancer. The colonoscopy showed only internal hemorrhoids. He relates mild constipation. No other GI complaints.  Current Medications, Allergies, Past Medical History, Past Surgical History, Family History and Social History were reviewed in [employer] record.  Physical Exam: General: Well developed, well nourished, no acute distress Head: Normocephalic and atraumatic Eyes:  sclerae anicteric, EOMI Ears: Normal auditory acuity Mouth: No deformity or lesions Lungs: Clear throughout to auscultation Heart: Regular rate and rhythm; no murmurs, rubs or bruits Abdomen: Soft, non tender and non distended. No masses, hepatosplenomegaly or hernias noted. Normal Bowel sounds Rectal: deferred to colonoscopy Musculoskeletal: Symmetrical with no gross deformities  Pulses:  Normal pulses noted Extremities: No clubbing, cyanosis, edema or deformities noted Neurological: Alert oriented x 4, grossly nonfocal Psychological:  Alert and cooperative. Normal mood and affect  Assessment and Recommendations:  1. Occult blood in stool, personal history of adenomatous colon polyps, family history of colon cancer in first-degree relative. We discussed the options of proceeding with colonoscopy to further evaluate or not proceeding with colonoscopy given that his exam in November 2014 was unremarkable except for hemorrhoids. Patient is leaning towards not doing a colonoscopy at this time which I think is reasonable. It is also reasonable to proceed with colonoscopy. Patient states he will further discuss with his wife and contact us. I advised him that, in general, we do not recommend routine  screening Hemoccults testing for 5 years after a complete, well prepped colonoscopy.

## 2015-03-09 ENCOUNTER — Encounter: Payer: Self-pay | Admitting: Gastroenterology

## 2016-01-05 ENCOUNTER — Ambulatory Visit (INDEPENDENT_AMBULATORY_CARE_PROVIDER_SITE_OTHER): Payer: 59 | Admitting: Sports Medicine

## 2016-01-05 ENCOUNTER — Encounter: Payer: Self-pay | Admitting: Sports Medicine

## 2016-01-05 ENCOUNTER — Ambulatory Visit
Admission: RE | Admit: 2016-01-05 | Discharge: 2016-01-05 | Disposition: A | Discharge status: Home / Self Care | Payer: 59 | Source: Ambulatory Visit | Attending: Sports Medicine | Admitting: Sports Medicine

## 2016-01-05 VITALS — BP 120/70 | Ht 70.0 in | Wt 185.0 lb

## 2016-01-05 DIAGNOSIS — M25552 Pain in left hip: Secondary | ICD-10-CM | POA: Diagnosis not present

## 2016-01-05 NOTE — Progress Notes (Signed)
Todd [name] - 60 y.o. male MRN [id number]  Date of birth: 1955-05-26  SUBJECTIVE:  Including CC & ROS.  CC: Left hip pain  Todd [name] is a 60yo male presenting today for intermittent, left hip pain onset 2 years ago but worsening over the past month. The pain initially was more in his lateral left hip but he has intermittent episodes of more medial, groin pain that radiates down his inner thigh. Pt mentions that he noticed the pain when he increased his running distance. The pain awakes him at night. Pt has taken ibuprofen intermittently but has not noticed much relief.   Pt also reports having left anterior lower lower leg pain with same onset as his hip pain. Pt reports that he recently purchased a new pair of shoes that he thinks exacerbated his leg pain.  He mentions that he has decreased his running distance from 60 miles per month and over the past month he has stopped running. Since he has stopped running he has not had the leg pain. He did not take any medications for it. Rest provides relief.   ROS: No weakness, numbness, gait abnormalities, back pain, joint swelling, injury, fever, chills   HISTORY: Past Medical, Surgical, Social, and Family History Reviewed & Updated per EMR.   Pertinent Historical Findings include: PMSHx - Hx of of rhinoplasty.  PSHx -  Denies using tobacco products. Enjoys playing golf and previous runner. Medications - zolpidem, ibuprofen   DATA REVIEWED: 01/05/2016  EXAM: DG HIP (WITH OR WITHOUT PELVIS) 2-3V LEFT  COMPARISON:  None.  FINDINGS: The bones are subjectively adequately mineralized. The pelvis exhibits no lytic or blastic lesion or acute or old fracture.  AP and lateral views of the left hip reveal preservation of the joint space. The articular surfaces of the femoral head and acetabulum remains smoothly rounded. The femoral neck, intertrochanteric, and subtrochanteric regions are normal.  IMPRESSION: There is no acute or significant  chronic bony abnormality of the left hip. If the patient's symptoms persist, MRI would be a useful next imaging step.  PHYSICAL EXAM:  VS: BP:120/70  HR: bpm  TEMP: ( )  RESP:   HT:5\' 10"  (177.8 cm)   WT:185 lb (83.9 kg)  BMI:26.6 PHYSICAL EXAM: Gen: NAD, alert, cooperative with exam, well-appearing Neuro: no gross deficits.  Psych:  alert and oriented Left Hip: No tenderness to palpation ROM limited with internal rotation to 35 degrees.  ER: 45 Deg, Flexion: 120 Deg, Extension: 100 Deg, Abduction: 45 Deg, Adduction: 45 Deg Strength WNL with internal rotation, external rotation, flexion, extension, abduction, adduction Pain with internal and external rotation of hip.  Right Hip: No tenderness to palpation ROM: IR: 45 Deg, ER: 45 Deg, Flexion: 120 Deg, Extension: 100 Deg, Abduction: 45 Deg, Adduction: 45 Deg Strength: IR: 5/5, ER: 5/5, Flexion: 5/5, Extension: 5/5, Abduction: 5/5, Adduction: 5/5 Pelvic alignment unremarkable to inspection and palpation.  No gait abnormality  No leg length difference  Pes planus bilaterally   ASSESSMENT & PLAN:   Left hip pain: Concerning for arthritis of hip joint due to limited ROM, patient's age and distribution of pain symptoms. Considered lumbar radiculopathy but less concerning due to no back pain, numbness, weakness or radiation down posterior leg. Bursitis less likely do to no tenderness upon exam and medial nature of pain.  -AP pelvis and lateral left hip done -Pt given inserts with scaphoid pads for pes planus  -Recommend anti-inflammatory for pain as needed. Discussed treatment possibilities of injections and surgery  if conservative treatment does not work.  -Recommend patient limited high impact exercises but able to continue cycling.   Patient seen and evaluated with the medical student. I agree with the above plan of care. I was able to review his x-rays. His x-rays show only minimal degenerative changes in the left hip. His  clinical picture, however, fits that of arthritis. We will proceed with treatment as outlined above. I did discuss the possibility of a diagnostic/therapeutic intra-articular cortisone injection if his symptoms do not improve. He will follow-up with me as needed.

## 2016-10-11 DIAGNOSIS — M9903 Segmental and somatic dysfunction of lumbar region: Secondary | ICD-10-CM | POA: Diagnosis not present

## 2016-10-11 DIAGNOSIS — M5432 Sciatica, left side: Secondary | ICD-10-CM | POA: Diagnosis not present

## 2016-10-11 DIAGNOSIS — M9902 Segmental and somatic dysfunction of thoracic region: Secondary | ICD-10-CM | POA: Diagnosis not present

## 2016-11-15 DIAGNOSIS — L821 Other seborrheic keratosis: Secondary | ICD-10-CM | POA: Diagnosis not present

## 2016-11-15 DIAGNOSIS — L570 Actinic keratosis: Secondary | ICD-10-CM | POA: Diagnosis not present

## 2016-11-15 DIAGNOSIS — B078 Other viral warts: Secondary | ICD-10-CM | POA: Diagnosis not present

## 2016-11-15 DIAGNOSIS — L814 Other melanin hyperpigmentation: Secondary | ICD-10-CM | POA: Diagnosis not present

## 2016-11-15 DIAGNOSIS — D1801 Hemangioma of skin and subcutaneous tissue: Secondary | ICD-10-CM | POA: Diagnosis not present

## 2016-12-23 DIAGNOSIS — Z1389 Encounter for screening for other disorder: Secondary | ICD-10-CM | POA: Diagnosis not present

## 2016-12-23 DIAGNOSIS — S60425A Blister (nonthermal) of left ring finger, initial encounter: Secondary | ICD-10-CM | POA: Diagnosis not present

## 2016-12-23 DIAGNOSIS — L988 Other specified disorders of the skin and subcutaneous tissue: Secondary | ICD-10-CM | POA: Diagnosis not present

## 2016-12-26 DIAGNOSIS — Z0000 Encounter for general adult medical examination without abnormal findings: Secondary | ICD-10-CM | POA: Diagnosis not present

## 2016-12-26 DIAGNOSIS — Z125 Encounter for screening for malignant neoplasm of prostate: Secondary | ICD-10-CM | POA: Diagnosis not present

## 2017-01-05 DIAGNOSIS — G4709 Other insomnia: Secondary | ICD-10-CM | POA: Diagnosis not present

## 2017-01-05 DIAGNOSIS — M25552 Pain in left hip: Secondary | ICD-10-CM | POA: Diagnosis not present

## 2017-01-05 DIAGNOSIS — L988 Other specified disorders of the skin and subcutaneous tissue: Secondary | ICD-10-CM | POA: Diagnosis not present

## 2017-01-05 DIAGNOSIS — Z1389 Encounter for screening for other disorder: Secondary | ICD-10-CM | POA: Diagnosis not present

## 2017-01-05 DIAGNOSIS — Z0000 Encounter for general adult medical examination without abnormal findings: Secondary | ICD-10-CM | POA: Diagnosis not present

## 2017-01-05 DIAGNOSIS — R3121 Asymptomatic microscopic hematuria: Secondary | ICD-10-CM | POA: Diagnosis not present

## 2017-11-01 ENCOUNTER — Encounter: Payer: Self-pay | Admitting: Gastroenterology

## 2017-11-15 DIAGNOSIS — L309 Dermatitis, unspecified: Secondary | ICD-10-CM | POA: Diagnosis not present

## 2017-11-15 DIAGNOSIS — L821 Other seborrheic keratosis: Secondary | ICD-10-CM | POA: Diagnosis not present

## 2017-11-15 DIAGNOSIS — L570 Actinic keratosis: Secondary | ICD-10-CM | POA: Diagnosis not present

## 2017-11-15 DIAGNOSIS — D1801 Hemangioma of skin and subcutaneous tissue: Secondary | ICD-10-CM | POA: Diagnosis not present

## 2017-11-15 DIAGNOSIS — L814 Other melanin hyperpigmentation: Secondary | ICD-10-CM | POA: Diagnosis not present

## 2017-11-22 ENCOUNTER — Encounter: Payer: Self-pay | Admitting: Gastroenterology

## 2017-11-22 ENCOUNTER — Other Ambulatory Visit: Payer: Self-pay

## 2017-11-22 ENCOUNTER — Ambulatory Visit (AMBULATORY_SURGERY_CENTER): Payer: Self-pay | Admitting: *Deleted

## 2017-11-22 VITALS — Ht 70.0 in | Wt 181.8 lb

## 2017-11-22 DIAGNOSIS — Z86010 Personal history of colonic polyps: Secondary | ICD-10-CM

## 2017-11-22 DIAGNOSIS — Z800 Family history of malignant neoplasm of digestive organs: Secondary | ICD-10-CM

## 2017-11-22 MED ORDER — SUPREP BOWEL PREP KIT 17.5-3.13-1.6 GM/177ML PO SOLN: 1.0000 | Freq: Once | ORAL | 0 refills | Status: AC

## 2017-11-22 NOTE — Progress Notes (Signed)
No egg or soy allergy known to patient  No issues with past sedation with any surgeries  or procedures, no intubation problems  No diet pills per patient No home 02 use per patient  No blood thinners per patient  Pt denies issues with constipation  No A fib or A flutter  EMMI video  Offered and declined by the patient. 

## 2017-12-06 ENCOUNTER — Encounter: Payer: Self-pay | Admitting: Gastroenterology

## 2017-12-06 ENCOUNTER — Ambulatory Visit (AMBULATORY_SURGERY_CENTER): Payer: BLUE CROSS/BLUE SHIELD | Admitting: Gastroenterology

## 2017-12-06 VITALS — BP 126/63 | HR 44 | Temp 98.0°F | Resp 19 | Ht 70.0 in | Wt 181.0 lb

## 2017-12-06 DIAGNOSIS — Z800 Family history of malignant neoplasm of digestive organs: Secondary | ICD-10-CM | POA: Diagnosis not present

## 2017-12-06 DIAGNOSIS — Z86010 Personal history of colonic polyps: Secondary | ICD-10-CM

## 2017-12-06 DIAGNOSIS — D123 Benign neoplasm of transverse colon: Secondary | ICD-10-CM

## 2017-12-06 MED ORDER — SODIUM CHLORIDE 0.9 % IV SOLN: 500.0000 mL | Freq: Once | INTRAVENOUS | Status: DC

## 2017-12-06 NOTE — Progress Notes (Signed)
To recovery, report to RN, VSS. 

## 2017-12-06 NOTE — Op Note (Addendum)
[facility] Patient Name: Todd [name] Procedure Date: 12/06/2017 10:41 AM MRN: [id number] Endoscopist: Ladene Artist , MD Age: 62 Referring MD:  Date of Birth: 1955-08-14 Gender: Male Account #: [id number] Procedure:                Colonoscopy Indications:              Surveillance: Personal history of adenomatous                            polyps on last colonoscopy 5 years ago. Family                            history of colon cancer, first degree relative. Medicines:                Monitored Anesthesia Care Procedure:                Pre-Anesthesia Assessment:                           - Prior to the procedure, a History and Physical                            was performed, and patient medications and                            allergies were reviewed. The patient's tolerance of                            previous anesthesia was also reviewed. The risks                            and benefits of the procedure and the sedation                            options and risks were discussed with the patient.                            All questions were answered, and informed consent                            was obtained. Prior Anticoagulants: The patient has                            taken no previous anticoagulant or antiplatelet                            agents. ASA Grade Assessment: II - A patient with                            mild systemic disease. After reviewing the risks                            and benefits, the patient was deemed in  satisfactory condition to undergo the procedure.                           After obtaining informed consent, the colonoscope                            was passed under direct vision. Throughout the                            procedure, the patient's blood pressure, pulse, and                            oxygen saturations were monitored continuously. The                            Model CF-HQ190L  [id number]) scope was introduced                            through the anus and advanced to the the cecum,                            identified by appendiceal orifice and ileocecal                            valve. The ileocecal valve, appendiceal orifice,                            and rectum were photographed. The quality of the                            bowel preparation was excellent. The colonoscopy                            was performed without difficulty. The patient                            tolerated the procedure well. Scope In: 10:50:43 AM Scope Out: 11:03:32 AM Scope Withdrawal Time: 0 hours 9 minutes 54 seconds  Total Procedure Duration: 0 hours 12 minutes 49 seconds  Findings:                 The perianal and digital rectal examinations were                            normal.                           A 7 mm polyp was found in the transverse colon. The                            polyp was sessile. The polyp was removed with a                            cold snare. Resection and retrieval were complete.  Internal hemorrhoids were found during                            retroflexion. The hemorrhoids were small and Grade                            I (internal hemorrhoids that do not prolapse).                           The exam was otherwise without abnormality on                            direct and retroflexion views. Complications:            No immediate complications. Estimated blood loss:                            None. Estimated Blood Loss:     Estimated blood loss: none. Impression:               - One 7 mm polyp in the transverse colon, removed                            with a cold snare. Resected and retrieved.                           - Internal hemorrhoids.                           - The examination was otherwise normal on direct                            and retroflexion views. Recommendation:           - Repeat colonoscopy  in 5 years for surveillance.                           - Patient has a contact number available for                            emergencies. The signs and symptoms of potential                            delayed complications were discussed with the                            patient. Return to normal activities tomorrow.                            Written discharge instructions were provided to the                            patient.                           - Resume previous diet.                           -  Continue present medications.                           - Await pathology results. Ladene Artist, MD 12/06/2017 11:09:53 AM This report has been signed electronically.

## 2017-12-06 NOTE — Patient Instructions (Signed)
YOU HAD AN ENDOSCOPIC PROCEDURE TODAY AT THE [facility]:   Refer to the procedure report that was given to you for any specific questions about what was found during the examination.  If the procedure report does not answer your questions, please call your gastroenterologist to clarify.  If you requested that your care partner not be given the details of your procedure findings, then the procedure report has been included in a sealed envelope for you to review at your convenience later.  YOU SHOULD EXPECT: Some feelings of bloating in the abdomen. Passage of more gas than usual.  Walking can help get rid of the air that was put into your GI tract during the procedure and reduce the bloating. If you had a lower endoscopy (such as a colonoscopy or flexible sigmoidoscopy) you may notice spotting of blood in your stool or on the toilet paper. If you underwent a bowel prep for your procedure, you may not have a normal bowel movement for a few days.  Please Note:  You might notice some irritation and congestion in your nose or some drainage.  This is from the oxygen used during your procedure.  There is no need for concern and it should clear up in a day or so.  SYMPTOMS TO REPORT IMMEDIATELY:   Following lower endoscopy (colonoscopy or flexible sigmoidoscopy):  Excessive amounts of blood in the stool  Significant tenderness or worsening of abdominal pains  Swelling of the abdomen that is new, acute  Fever of 100F or higher  Please see handouts given to you on Polyps and Hemorrhoids.  For urgent or emergent issues, a gastroenterologist can be reached at any hour by calling [phone].   DIET:  We do recommend a small meal at first, but then you may proceed to your regular diet.  Drink plenty of fluids but you should avoid alcoholic beverages for 24 hours.  ACTIVITY:  You should plan to take it easy for the rest of today and you should NOT DRIVE or use heavy machinery until tomorrow  (because of the sedation medicines used during the test).    FOLLOW UP: Our staff will call the number listed on your records the next business day following your procedure to check on you and address any questions or concerns that you may have regarding the information given to you following your procedure. If we do not reach you, we will leave a message.  However, if you are feeling well and you are not experiencing any problems, there is no need to return our call.  We will assume that you have returned to your regular daily activities without incident.  If any biopsies were taken you will be contacted by phone or by letter within the next 1-3 weeks.  Please call us at [phone] if you have not heard about the biopsies in 3 weeks.    SIGNATURES/CONFIDENTIALITY: You and/or your care partner have signed paperwork which will be entered into your electronic medical record.  These signatures attest to the fact that that the information above on your After Visit Summary has been reviewed and is understood.  Full responsibility of the confidentiality of this discharge information lies with you and/or your care-partner.  Thank you for letting us take care of your healthcare needs today. 

## 2017-12-06 NOTE — Progress Notes (Signed)
Pt's states no medical or surgical changes since previsit or office visit. 

## 2017-12-06 NOTE — Progress Notes (Signed)
Called to room to assist during endoscopic procedure.  Patient ID and intended procedure confirmed with present staff. Received instructions for my participation in the procedure from the performing physician.  

## 2017-12-07 ENCOUNTER — Telehealth: Payer: Self-pay

## 2017-12-07 NOTE — Telephone Encounter (Signed)
  Follow up Call-  Call back number 12/06/2017  Post procedure Call Back phone  # [phone]  Permission to leave phone message Yes  Some recent data might be hidden     Patient questions:  Do you have a fever, pain , or abdominal swelling? No. Pain Score  0 *  Have you tolerated food without any problems? Yes.    Have you been able to return to your normal activities? Yes.    Do you have any questions about your discharge instructions: Diet   No. Medications  No. Follow up visit  No.  Do you have questions or concerns about your Care? No.  Actions: * If pain score is 4 or above: No action needed, pain <4.

## 2017-12-26 ENCOUNTER — Encounter: Payer: Self-pay | Admitting: Gastroenterology

## 2018-01-08 DIAGNOSIS — Z0000 Encounter for general adult medical examination without abnormal findings: Secondary | ICD-10-CM | POA: Diagnosis not present

## 2018-01-08 DIAGNOSIS — Z125 Encounter for screening for malignant neoplasm of prostate: Secondary | ICD-10-CM | POA: Diagnosis not present

## 2018-01-12 DIAGNOSIS — Z1212 Encounter for screening for malignant neoplasm of rectum: Secondary | ICD-10-CM | POA: Diagnosis not present

## 2018-01-15 DIAGNOSIS — R42 Dizziness and giddiness: Secondary | ICD-10-CM | POA: Diagnosis not present

## 2018-01-15 DIAGNOSIS — Z0000 Encounter for general adult medical examination without abnormal findings: Secondary | ICD-10-CM | POA: Diagnosis not present

## 2018-01-15 DIAGNOSIS — G4709 Other insomnia: Secondary | ICD-10-CM | POA: Diagnosis not present

## 2018-01-15 DIAGNOSIS — M25552 Pain in left hip: Secondary | ICD-10-CM | POA: Diagnosis not present

## 2018-01-15 DIAGNOSIS — Z1331 Encounter for screening for depression: Secondary | ICD-10-CM | POA: Diagnosis not present

## 2018-02-06 NOTE — Progress Notes (Signed)
[name] [address] [place], [place] [address] Phone: [phone] Subjective:   Todd Todd [name], am serving as a scribe for Dr. Hulan [name].  I'm seeing this patient by the request  of:    CC: left leg pain   Todd Todd [name]  Todd Todd [name] is a 63 y.o. male coming in with complaint of left leg pain for 2 years. Pain character is achy. Has history of osteoarthritis of left hip. Pain over GT and radiates down to the left leg into the calf. Also feels pain over the posterior aspect of knee and anterior tibia. Feels that his leg is weak and has pain with and without activity. Pain with golf swing and patient is right handed. Walked 18 holes today. Has stopped running recently and is trying to transition to station biking. Does not stretch. Pain in leg can wake patient up at night.       Past Medical History:  Diagnosis Date  . Allergy   . BPH (benign prostatic hyperplasia)    Past Surgical History:  Procedure Laterality Date  . COLONOSCOPY  11/30/07  . NASAL SEPTUM SURGERY     Social History   Socioeconomic History  . Marital status: Married    Spouse name: Not on file  . Number of children: 3  . Years of education: Not on file  . Highest education level: Not on file  Occupational History  . Occupation: Engineer, materials  . Financial resource strain: Not on file  . Food insecurity:    Worry: Not on file    Inability: Not on file  . Transportation needs:    Medical: Not on file    Non-medical: Not on file  Tobacco Use  . Smoking status: Former Smoker    Last attempt to quit: 01/31/2001    Years since quitting: 17.0  . Smokeless tobacco: Never Used  Substance and Sexual Activity  . Alcohol use: Yes    Alcohol/week: 14.0 standard drinks    Types: 14 Cans of beer per week  . Drug use: Todd [name]  . Sexual activity: Not on file  Lifestyle  . Physical activity:    Days per week: Not on file    Minutes per session: Not on file  . Stress:  Not on file  Relationships  . Social connections:    Talks on phone: Not on file    Gets together: Not on file    Attends religious service: Not on file    Active member of club or organization: Not on file    Attends meetings of clubs or organizations: Not on file    Relationship status: Not on file  Other Topics Concern  . Not on file  Social History Narrative  . Not on file   Todd [name] Known Allergies Family History  Problem Relation Age of Onset  . Cancer Father 61       cancer  . Colon cancer Father   . Heart disease Mother 30       chf-  . Colon cancer Paternal Grandmother   . Esophageal cancer Neg Hx   . Rectal cancer Neg Hx   . Stomach cancer Neg Hx   . Colon polyps Neg Hx        Current Outpatient Medications (Analgesics):  .  ibuprofen (ADVIL,MOTRIN) 200 MG tablet, Take 200 mg by mouth every 6 (six) hours as needed.     Current Outpatient Medications (Other):  Marland Kitchen  Multiple  Vitamins-Minerals (CENTRUM SILVER ULTRA MENS PO), Take 1 tablet by mouth daily. .  tamsulosin (FLOMAX) 0.4 MG CAPS capsule,  .  zolpidem (AMBIEN) 10 MG tablet, Take 10 mg by mouth at bedtime as needed for sleep. Marland Kitchen  gabapentin (NEURONTIN) 100 MG capsule, Take 2 capsules (200 mg total) by mouth at bedtime.    Past medical history, social, surgical and family history all reviewed in electronic medical record.  Todd [name] pertanent information unless stated regarding to the chief complaint.   Review of Systems:  Todd [name] headache, visual changes, nausea, vomiting, diarrhea, constipation, dizziness, abdominal pain, skin rash, fevers, chills, night sweats, weight loss, swollen lymph nodes, body aches, joint swelling, muscle aches, chest pain, shortness of breath, mood changes.   Objective  Blood pressure 124/80, pulse 66, height 5\' 10"  (1.778 m), weight 181 lb (82.1 kg), SpO2 98 %.   General: Todd [name] apparent distress alert and oriented x3 mood and affect normal, dressed appropriately.  HEENT: Pupils equal,  extraocular movements intact  Respiratory: Patient's speak in full sentences and does not appear short of breath  Cardiovascular: Todd [name] lower extremity edema, non tender, Todd [name] erythema  Skin: Warm dry intact with Todd [name] signs of infection or rash on extremities or on axial skeleton.  Abdomen: Soft nontender  Neuro: Cranial nerves II through XII are intact, neurovascularly intact in all extremities with 2+ DTRs and 2+ pulses.  Lymph: Todd [name] lymphadenopathy of posterior or anterior cervical chain or axillae bilaterally.  Gait normal with good balance and coordination.  MSK:  Non tender with full range of motion and good stability and symmetric strength and tone of shoulders, elbows, wrist, , and ankles bilaterally.  Left foot exam shows the patient does have severe pes planus with overpronation of the hindfoot.  Breakdown of the transverse arch noted as well.  Patient does have hypertrophy of the lateral gastroc compared to the medial.  Patient has a very mild varus deformity of the knee. Back exam is unremarkable.  Mild tightness with [name] test but Todd [name] significant pain.  Negative straight leg test noted.  Mild loss of lordosis  Limited musculoskeletal ultrasound was performed and interpreted by Todd Todd [name]  Limited ultrasound of patient's lateral ankle is fairly unremarkable.  Very mild degenerative changes noted.   Impression and Recommendations:     This case required medical decision making of moderate complexity. The above documentation has been reviewed and is accurate and complete Todd Pulley, DO       Note: This dictation was prepared with Dragon dictation along with smaller phrase technology. Any transcriptional errors that result from this process are unintentional.

## 2018-02-07 ENCOUNTER — Ambulatory Visit (INDEPENDENT_AMBULATORY_CARE_PROVIDER_SITE_OTHER)
Admission: RE | Admit: 2018-02-07 | Discharge: 2018-02-07 | Disposition: A | Discharge status: Home / Self Care | Payer: BLUE CROSS/BLUE SHIELD | Source: Ambulatory Visit | Attending: Family Medicine | Admitting: Family Medicine

## 2018-02-07 ENCOUNTER — Ambulatory Visit (INDEPENDENT_AMBULATORY_CARE_PROVIDER_SITE_OTHER): Payer: BLUE CROSS/BLUE SHIELD | Admitting: Family Medicine

## 2018-02-07 ENCOUNTER — Ambulatory Visit: Payer: Self-pay

## 2018-02-07 ENCOUNTER — Encounter

## 2018-02-07 ENCOUNTER — Encounter: Payer: Self-pay | Admitting: Family Medicine

## 2018-02-07 VITALS — BP 124/80 | HR 66 | Ht 70.0 in | Wt 181.0 lb

## 2018-02-07 DIAGNOSIS — M25552 Pain in left hip: Secondary | ICD-10-CM

## 2018-02-07 DIAGNOSIS — M79605 Pain in left leg: Secondary | ICD-10-CM | POA: Insufficient documentation

## 2018-02-07 DIAGNOSIS — M160 Bilateral primary osteoarthritis of hip: Secondary | ICD-10-CM | POA: Diagnosis not present

## 2018-02-07 DIAGNOSIS — M5136 Other intervertebral disc degeneration, lumbar region: Secondary | ICD-10-CM | POA: Diagnosis not present

## 2018-02-07 MED ORDER — GABAPENTIN 100 MG PO CAPS: 200.0000 mg | ORAL_CAPSULE | Freq: Every day | ORAL | 3 refills | Status: DC

## 2018-02-07 NOTE — Assessment & Plan Note (Signed)
Difficult to assess.  Patient does have signs and symptoms are more of a nerve related problem.  Patient has pain only seems to be with running.  Or exertion.  Exertional compartment syndrome is within the differential for sure.  Also lumbar radiculopathy.  X-rays of patient's back and hip ordered today.  Discussed icing regimen, topical anti-inflammatories, gabapentin given, discussed compression sleeve for the leg as well as over-the-counter and custom orthotics that I think will be beneficial.  Patient will follow-up with me again in 4 to 8 weeks.

## 2018-02-07 NOTE — Patient Instructions (Addendum)
Good to see you  Ice is your friend Ice 20 minutes 2 times daily. Usually after activity and before bed. Exercises 3 times a week.  Calf compression or compression socks with exercise Gabapentin 200mg  at night Spenco orthotics "total support" online would be great  Over the counter get  Vitamin D 2000 IU dialy  Tart cherry extract any dose at night Xray downstairs See me agai in 3-6 weeks I am sorry for being late today

## 2018-02-09 ENCOUNTER — Encounter: Payer: Self-pay | Admitting: Family Medicine

## 2018-02-23 ENCOUNTER — Encounter: Payer: Self-pay | Admitting: Family Medicine

## 2018-02-28 ENCOUNTER — Encounter: Payer: Self-pay | Admitting: Family Medicine

## 2018-02-28 ENCOUNTER — Ambulatory Visit (INDEPENDENT_AMBULATORY_CARE_PROVIDER_SITE_OTHER): Payer: BLUE CROSS/BLUE SHIELD | Admitting: Family Medicine

## 2018-02-28 DIAGNOSIS — M79605 Pain in left leg: Secondary | ICD-10-CM

## 2018-02-28 NOTE — Patient Instructions (Signed)
Good to see you  Ice is your friend Gabapentin up to you  Eat within 30 minutes of working out See me again in 6-8 weeks

## 2018-02-28 NOTE — Assessment & Plan Note (Signed)
Still difficult to know for sure.  Patient's symptoms do seem to correspond with more of a neurogenic.  Discussed further work-up of the back which patient declined.  Patient states that it is not completely disabling and as he is able to enjoy most of his activities.  We did discuss the possibility of advanced imaging, EMGs, or even vascular blood flow testing which patient also declined.  Patient will continue with conservative therapy and follow-up with me again in 2 months

## 2018-02-28 NOTE — Progress Notes (Signed)
[facility] [address], [place] [address] Phone: [phone] Subjective:   Todd [name], am serving as a scribe for Dr. Hulan Saas.   CC: Left leg pain follow-up  NLG:[id number]  Todd [name] is a 64 y.o. male coming in with complaint of back and leg pain. Continues to have leg pain that moves from lateral hip to below the patella. Pain was improving but patient now states that it is the same as last visit. Has been using tart cherry and gabapentin. Is not sure if gabapentin is working but does state that pain seems less. Has been running and walking intermittently. He said that his pain has improved due to that adjustment.         Past Medical History:  Diagnosis Date  . Allergy   . BPH (benign prostatic hyperplasia)    Past Surgical History:  Procedure Laterality Date  . COLONOSCOPY  11/30/07  . NASAL SEPTUM SURGERY     Social History   Socioeconomic History  . Marital status: Married    Spouse name: Not on file  . Number of children: 3  . Years of education: Not on file  . Highest education level: Not on file  Occupational History  . Occupation: Engineer, materials  . Financial resource strain: Not on file  . Food insecurity:    Worry: Not on file    Inability: Not on file  . Transportation needs:    Medical: Not on file    Non-medical: Not on file  Tobacco Use  . Smoking status: Former Smoker    Last attempt to quit: 01/31/2001    Years since quitting: 17.0  . Smokeless tobacco: Never Used  Substance and Sexual Activity  . Alcohol use: Yes    Alcohol/week: 14.0 standard drinks    Types: 14 Cans of beer per week  . Drug use: [name]  . Sexual activity: Not on file  Lifestyle  . Physical activity:    Days per week: Not on file    Minutes per session: Not on file  . Stress: Not on file  Relationships  . Social connections:    Talks on phone: Not on file    Gets together: Not on file    Attends religious  service: Not on file    Active member of club or organization: Not on file    Attends meetings of clubs or organizations: Not on file    Relationship status: Not on file  Other Topics Concern  . Not on file  Social History Narrative  . Not on file   [name] Known Allergies Family History  Problem Relation Age of Onset  . Cancer Father 64       cancer  . Colon cancer Father   . Heart disease Mother 6       chf-  . Colon cancer Paternal Grandmother   . Esophageal cancer Neg Hx   . Rectal cancer Neg Hx   . Stomach cancer Neg Hx   . Colon polyps Neg Hx        Current Outpatient Medications (Analgesics):  .  ibuprofen (ADVIL,MOTRIN) 200 MG tablet, Take 200 mg by mouth every 6 (six) hours as needed.     Current Outpatient Medications (Other):  .  gabapentin (NEURONTIN) 100 MG capsule, Take 2 capsules (200 mg total) by mouth at bedtime. .  Misc Natural Products (TART CHERRY ADVANCED PO), Take by mouth. .  Multiple Vitamins-Minerals (  CENTRUM SILVER ULTRA MENS PO), Take 1 tablet by mouth daily. .  tamsulosin (FLOMAX) 0.4 MG CAPS capsule,  .  Vitamin D, Cholecalciferol, 50 MCG (2000 UT) CAPS, Take by mouth. .  zolpidem (AMBIEN) 10 MG tablet, Take 10 mg by mouth at bedtime as needed for sleep.    Past medical history, social, surgical and family history all reviewed in electronic medical record.  [name] pertanent information unless stated regarding to the chief complaint.   Review of Systems:  [name] headache, visual changes, nausea, vomiting, diarrhea, constipation, dizziness, abdominal pain, skin rash, fevers, chills, night sweats, weight loss, swollen lymph nodes, body aches, joint swelling, muscle aches, chest pain, shortness of breath, mood changes.   Objective  Blood pressure 122/78, pulse 61, height 5\' 10"  (1.778 m), weight 178 lb (80.7 kg), SpO2 98 %.   General: [name] apparent distress alert and oriented x3 mood and affect normal, dressed appropriately.  HEENT: Pupils equal,  extraocular movements intact  Respiratory: Patient's speak in full sentences and does not appear short of breath  Cardiovascular: [name] lower extremity edema, non tender, [name] erythema  Skin: Warm dry intact with [name] signs of infection or rash on extremities or on axial skeleton.  Abdomen: Soft nontender  Neuro: Cranial nerves II through XII are intact, neurovascularly intact in all extremities with 2+ DTRs and 2+ pulses.  Lymph: [name] lymphadenopathy of posterior or anterior cervical chain or axillae bilaterally.  Gait normal with good balance and coordination.  MSK:  Non tender with full range of motion and good stability and symmetric strength and tone of shoulders, elbows, wrist, hip, knee and ankles bilaterally.  Patient back exam is fairly unremarkable except for a positive Faber test.  Mild limited range of motion of the hip noted by 5 degrees in all planes.   Impression and Recommendations:     The above documentation has been reviewed and is accurate and complete Todd Pulley, DO       Note: This dictation was prepared with Dragon dictation along with smaller phrase technology. Any transcriptional errors that result from this process are unintentional.

## 2018-04-25 ENCOUNTER — Ambulatory Visit: Payer: BLUE CROSS/BLUE SHIELD | Admitting: Family Medicine

## 2018-11-27 DIAGNOSIS — M1612 Unilateral primary osteoarthritis, left hip: Secondary | ICD-10-CM | POA: Insufficient documentation

## 2019-05-24 DIAGNOSIS — M51369 Other intervertebral disc degeneration, lumbar region without mention of lumbar back pain or lower extremity pain: Secondary | ICD-10-CM | POA: Insufficient documentation

## 2019-05-24 DIAGNOSIS — M5136 Other intervertebral disc degeneration, lumbar region: Secondary | ICD-10-CM | POA: Insufficient documentation

## 2019-06-14 DIAGNOSIS — M1611 Unilateral primary osteoarthritis, right hip: Secondary | ICD-10-CM | POA: Insufficient documentation

## 2020-05-27 NOTE — Progress Notes (Signed)
[facility] [place] Phone: [phone] Subjective:   Fontaine No, am serving as a scribe for Dr. Hulan Saas. This visit occurred during the SARS-CoV-2 public health emergency.  Safety protocols were in place, including screening questions prior to the visit, additional usage of staff PPE, and extensive cleaning of exam room while observing appropriate contact time as indicated for disinfecting solutions.   I'm seeing this patient by the request  of:  Velna Hatchet, MD  CC: Low back and hip pain  GEX:[id number]   02/28/2018 Still difficult to know for sure.  Patient's symptoms do seem to correspond with more of a neurogenic.  Discussed further work-up of the back which patient declined.  Patient states that it is not completely disabling and as he is able to enjoy most of his activities.  We did discuss the possibility of advanced imaging, EMGs, or even vascular blood flow testing which patient also declined.  Patient will continue with conservative therapy and follow-up with me again in 2 months   Update 05/28/2020 ERMINIO NYGARD is a 65 y.o. male coming in with complaint of back and hip pain. Patient states that he did have an injection in thoracic spine at [facility] since last visit. Heard a pop in thoracic spine with golfing.  Patient states that that seems to be getting better.  Main complaint pain in R glute and with extending R knee. Also has giving out of left and right legs intermittently. Does golf and perform cardio. Feels a locking sensation in the hips with stationary biking.  Patient feels like certain things such as walking has become much more difficult.    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  Mild to moderate  Pelvis xray 02/07/2018 IMPRESSION: Degenerative changes of BILATERAL hip joints.  Mild to moderate.  Independently visualized today.      Past Medical History:  Diagnosis Date  . Allergy   . BPH (benign prostatic hyperplasia)    Past Surgical History:  Procedure Laterality Date  . COLONOSCOPY  11/30/07  . NASAL SEPTUM SURGERY     Social History   Socioeconomic History  . Marital status: Married    Spouse name: Not on file  . Number of children: 3  . Years of education: Not on file  . Highest education level: Not on file  Occupational History  . Occupation: Finance  Tobacco Use  . Smoking status: Former Smoker    Quit date: 01/31/2001    Years since quitting: 19.3  . Smokeless tobacco: Never Used  Vaping Use  . Vaping Use: Never used  Substance and Sexual Activity  . Alcohol use: Yes    Alcohol/week: 14.0 standard drinks    Types: 14 Cans of beer per week  . Drug use: No  . Sexual activity: Not on file  Other Topics Concern  . Not on file  Social History Narrative  . Not on file   Social Determinants of Health   Financial Resource Strain: Not on file  Food Insecurity: Not on file  Transportation Needs: Not on file  Physical Activity: Not on file  Stress: Not on file  Social Connections: Not on file   No Known Allergies Family History  Problem Relation Age of Onset  . Cancer Father 67       cancer  . Colon cancer Father   . Heart disease Mother 68  chf-  . Colon cancer Paternal Grandmother   . Esophageal cancer Neg Hx   . Rectal cancer Neg Hx   . Stomach cancer Neg Hx   . Colon polyps Neg Hx        Current Outpatient Medications (Analgesics):  .  ibuprofen (ADVIL,MOTRIN) 200 MG tablet, Take 200 mg by mouth every 6 (six) hours as needed.   Current Outpatient Medications (Other):  .  gabapentin (NEURONTIN) 100 MG capsule, Take 2 capsules (200 mg total) by mouth at bedtime. .  Misc Natural Products (TART CHERRY ADVANCED PO), Take by mouth. .  Multiple Vitamins-Minerals (CENTRUM SILVER ULTRA MENS PO), Take 1 tablet by mouth daily. .  tamsulosin (FLOMAX) 0.4 MG CAPS capsule,  .   Vitamin D, Cholecalciferol, 50 MCG (2000 UT) CAPS, Take by mouth. .  zolpidem (AMBIEN) 10 MG tablet, Take 10 mg by mouth at bedtime as needed for sleep.   Reviewed prior external information including notes and imaging from  primary care provider As well as notes that were available from care everywhere and other healthcare systems.  Past medical history, social, surgical and family history all reviewed in electronic medical record.  No pertanent information unless stated regarding to the chief complaint.   Review of Systems:  No headache, visual changes, nausea, vomiting, diarrhea, constipation, dizziness, abdominal pain, skin rash, fevers, chills, night sweats, weight loss, swollen lymph nodes,joint swelling, chest pain, shortness of breath, mood changes. POSITIVE muscle aches, body aches  Objective  Blood pressure 126/82, pulse (!) 59, height 5\' 10"  (1.778 m), weight 174 lb (78.9 kg), SpO2 99 %.   General: No apparent distress alert and oriented x3 mood and affect normal, dressed appropriately.  HEENT: Pupils equal, extraocular movements intact  Respiratory: Patient's speak in full sentences and does not appear short of breath  Cardiovascular: No lower extremity edema, non tender, no erythema  Gait antalgic gait noted. Patient's hips have very minimal internal rotation seems to be right greater than left.  Patient has very minimal external rotation as well with only 20 degrees on the right side and 30 degrees on the left side.  Neurovascularly intact distally.  Mild weakness with hip abductor bilaterally.  Loss of lordosis of the lumbar spine deep tendon reflexes do appear to be intact.   Impression and Recommendations:     The above documentation has been reviewed and is accurate and complete Lyndal Pulley, DO

## 2020-05-28 ENCOUNTER — Encounter: Payer: Self-pay | Admitting: Family Medicine

## 2020-05-28 ENCOUNTER — Ambulatory Visit (INDEPENDENT_AMBULATORY_CARE_PROVIDER_SITE_OTHER): Payer: 59 | Admitting: Family Medicine

## 2020-05-28 ENCOUNTER — Other Ambulatory Visit: Payer: Self-pay

## 2020-05-28 ENCOUNTER — Ambulatory Visit (INDEPENDENT_AMBULATORY_CARE_PROVIDER_SITE_OTHER): Payer: 59

## 2020-05-28 VITALS — BP 126/82 | HR 59 | Ht 70.0 in | Wt 174.0 lb

## 2020-05-28 DIAGNOSIS — M25552 Pain in left hip: Secondary | ICD-10-CM

## 2020-05-28 DIAGNOSIS — M545 Low back pain, unspecified: Secondary | ICD-10-CM

## 2020-05-28 DIAGNOSIS — M25551 Pain in right hip: Secondary | ICD-10-CM

## 2020-05-28 DIAGNOSIS — M1611 Unilateral primary osteoarthritis, right hip: Secondary | ICD-10-CM | POA: Diagnosis not present

## 2020-05-28 NOTE — Assessment & Plan Note (Addendum)
I believe the patient does have significant arthritic changes of the hips bilaterally.  Seems to be right greater than left at the moment with the pain but on x-ray today patient did have shows possibly mild worsening on the left side.  I do believe that this is contributing to most of patient's discomfort and pain at this point and does need to consider the possibility of replacement.  Patient wants to continue to remain active but is going to find it difficult to do so with the amount of arthritis that he has in his hip.  Patient will be referred today to further evaluate the possibility surgeon and see how patient does.  Differential includes lumbar radiculopathy but I do not think that it is as likely.  Time with patient today reviewing patient's previous notes, previous imaging and imaging with him today greater than 33 minutes

## 2020-05-28 NOTE — Patient Instructions (Addendum)
  [facility]-Dr. Latanya Maudlin [phone]

## 2020-05-29 ENCOUNTER — Encounter: Payer: Self-pay | Admitting: Family Medicine

## 2020-06-12 ENCOUNTER — Encounter: Payer: Self-pay | Admitting: Family Medicine

## 2020-06-30 ENCOUNTER — Ambulatory Visit: Payer: BLUE CROSS/BLUE SHIELD | Admitting: Family Medicine

## 2020-07-01 ENCOUNTER — Encounter: Payer: Self-pay | Admitting: Family Medicine

## 2020-08-12 ENCOUNTER — Other Ambulatory Visit (HOSPITAL_COMMUNITY): Payer: Self-pay | Admitting: Orthopaedic Surgery

## 2020-08-12 ENCOUNTER — Ambulatory Visit (HOSPITAL_COMMUNITY)
Admission: RE | Admit: 2020-08-12 | Discharge: 2020-08-12 | Disposition: A | Discharge status: Home / Self Care | Payer: 59 | Source: Ambulatory Visit | Attending: Orthopaedic Surgery | Admitting: Orthopaedic Surgery

## 2020-08-12 ENCOUNTER — Other Ambulatory Visit: Payer: Self-pay

## 2020-08-12 DIAGNOSIS — Z96649 Presence of unspecified artificial hip joint: Secondary | ICD-10-CM | POA: Diagnosis present

## 2021-02-25 ENCOUNTER — Encounter: Payer: Self-pay | Admitting: Family Medicine

## 2021-03-01 ENCOUNTER — Ambulatory Visit: Payer: 59 | Admitting: Family Medicine

## 2021-03-02 ENCOUNTER — Other Ambulatory Visit: Payer: Self-pay | Admitting: Internal Medicine

## 2021-03-02 DIAGNOSIS — Z0000 Encounter for general adult medical examination without abnormal findings: Secondary | ICD-10-CM

## 2021-03-04 ENCOUNTER — Ambulatory Visit
Admission: RE | Admit: 2021-03-04 | Discharge: 2021-03-04 | Disposition: A | Discharge status: Home / Self Care | Payer: No Typology Code available for payment source | Source: Ambulatory Visit | Attending: Internal Medicine | Admitting: Internal Medicine

## 2021-03-04 DIAGNOSIS — Z0000 Encounter for general adult medical examination without abnormal findings: Secondary | ICD-10-CM

## 2021-03-04 NOTE — Progress Notes (Signed)
Todd [name] [address] [place] Phone: [phone] Subjective:   [name], am serving as a scribe for Dr. Hulan Saas. This visit occurred during the SARS-CoV-2 public health emergency.  Safety protocols were in place, including screening questions prior to the visit, additional usage of staff PPE, and extensive cleaning of exam room while observing appropriate contact time as indicated for disinfecting solutions.   I'm seeing this patient by the request  of:  Velna Hatchet, MD  CC: bilateral hip and back pain   VPX:[id number]  05/28/2020 I believe the patient does have significant arthritic changes of the hips bilaterally.  Seems to be right greater than left at the moment with the pain but on x-ray today patient did have shows possibly mild worsening on the left side.  I do believe that this is contributing to most of patient's discomfort and pain at this point and does need to consider the possibility of replacement.  Patient wants to continue to remain active but is going to find it difficult to do so with the amount of arthritis that he has in his hip.  Patient will be referred today to further evaluate the possibility surgeon and see how patient does.  Differential includes lumbar radiculopathy but I do not think that it is as likely.  Time with patient today reviewing patient's previous notes, previous imaging and imaging with him today greater than 33 minutes  Update 03/09/2021 Todd [name] is a 66 y.o. male coming in with complaint of SI joint pain. B hip replacements in 2022.  Patient states right side pain SI and gluteal pain. Some pain anteriorly. Wants to become more active.  Lumbar xrays in 2022 show mild DDD of lumbar spine     Past Medical History:  Diagnosis Date   Allergy    BPH (benign prostatic hyperplasia)    Past Surgical History:  Procedure Laterality Date   COLONOSCOPY  11/30/07   NASAL SEPTUM SURGERY      Social History   Socioeconomic History   Marital status: Married    Spouse name: Not on file   Number of children: 3   Years of education: Not on file   Highest education level: Not on file  Occupational History   Occupation: Finance  Tobacco Use   Smoking status: Former    Types: Cigarettes    Quit date: 01/31/2001    Years since quitting: 20.1   Smokeless tobacco: Never  Vaping Use   Vaping Use: Never used  Substance and Sexual Activity   Alcohol use: Yes    Alcohol/week: 14.0 standard drinks    Types: 14 Cans of beer per week   Drug use: No   Sexual activity: Not on file  Other Topics Concern   Not on file  Social History Narrative   Not on file   Social Determinants of Health   Financial Resource Strain: Not on file  Food Insecurity: Not on file  Transportation Needs: Not on file  Physical Activity: Not on file  Stress: Not on file  Social Connections: Not on file   No Known Allergies Family History  Problem Relation Age of Onset   Cancer Father 107       cancer   Colon cancer Father    Heart disease Mother 43       chf-   Colon cancer Paternal Grandmother    Esophageal cancer Neg Hx    Rectal cancer Neg Hx  Stomach cancer Neg Hx    Colon polyps Neg Hx        Current Outpatient Medications (Analgesics):    ibuprofen (ADVIL,MOTRIN) 200 MG tablet, Take 200 mg by mouth every 6 (six) hours as needed.   Current Outpatient Medications (Other):    gabapentin (NEURONTIN) 100 MG capsule, Take 2 capsules (200 mg total) by mouth at bedtime.   Misc Natural Products (TART CHERRY ADVANCED PO), Take by mouth.   Multiple Vitamins-Minerals (CENTRUM SILVER ULTRA MENS PO), Take 1 tablet by mouth daily.   tamsulosin (FLOMAX) 0.4 MG CAPS capsule,    Vitamin D, Cholecalciferol, 50 MCG (2000 UT) CAPS, Take by mouth.   zolpidem (AMBIEN) 10 MG tablet, Take 10 mg by mouth at bedtime as needed for sleep.   Reviewed prior external information including notes and  imaging from  primary care provider As well as notes that were available from care everywhere and other healthcare systems.  Past medical history, social, surgical and family history all reviewed in electronic medical record.  No pertanent information unless stated regarding to the chief complaint.   Review of Systems:  No headache, visual changes, nausea, vomiting, diarrhea, constipation, dizziness, abdominal pain, skin rash, fevers, chills, night sweats, weight loss, swollen lymph nodes, body aches, joint swelling, chest pain, shortness of breath, mood changes. POSITIVE muscle aches  Objective  Blood pressure 134/78, pulse (!) 59, height 5\' 10"  (1.778 m), weight 181 lb (82.1 kg), SpO2 98 %.   General: No apparent distress alert and oriented x3 mood and affect normal, dressed appropriately.  HEENT: Pupils equal, extraocular movements intact  Respiratory: Patient's speak in full sentences and does not appear short of breath  Cardiovascular: No lower extremity edema, non tender, no erythema  Gait normal with good balance and coordination.  MSK: Patient low back does have some loss lordosis.  Some tenderness to palpation over the greater trochanteric area bilaterally right greater than left.  Patient does have tightness with FABER test bilaterally.  No tenderness over the right sacroiliac joint in the piriformis area.  After verbal consent patient was prepped with alcohol swabs and with a 21-gauge 2 inch needle injected into the right piriformis tendon sheath proximally.  A total of 2 cc of 0.5% Marcaine and 1 cc of Kenalog 40 mg/mL used.  Patient responded well to the injections.  No blood loss.  Postinjection instructions given.    Impression and Recommendations:     The above documentation has been reviewed and is accurate and complete Lyndal Pulley, DO

## 2021-03-09 ENCOUNTER — Encounter: Payer: Self-pay | Admitting: Family Medicine

## 2021-03-09 ENCOUNTER — Ambulatory Visit (INDEPENDENT_AMBULATORY_CARE_PROVIDER_SITE_OTHER): Payer: 59 | Admitting: Family Medicine

## 2021-03-09 ENCOUNTER — Other Ambulatory Visit: Payer: Self-pay

## 2021-03-09 VITALS — BP 134/78 | HR 59 | Ht 70.0 in | Wt 181.0 lb

## 2021-03-09 DIAGNOSIS — M5136 Other intervertebral disc degeneration, lumbar region: Secondary | ICD-10-CM | POA: Diagnosis not present

## 2021-03-09 DIAGNOSIS — G5701 Lesion of sciatic nerve, right lower limb: Secondary | ICD-10-CM

## 2021-03-09 DIAGNOSIS — M25551 Pain in right hip: Secondary | ICD-10-CM | POA: Diagnosis not present

## 2021-03-09 NOTE — Assessment & Plan Note (Signed)
Concerned that patient may be having more of a lumbar radiculopathy.  We will get an EMG.  We will consider the possibility of restarting gabapentin depending on his.  Does have some mild atrophy that could be postsurgical.  Patient does have limited range of motion still at the replacement but this could be secondary to muscle tightness.  Patient does have a positive Trendelenburg on the right side and looking somewhat concerning.  Patient repeated again in the 4 to 6 weeks.  At that point we will discuss the possibility of an MRI of the lumbar spine as well as the possibility of a sacroiliac joint injection.

## 2021-03-09 NOTE — Patient Instructions (Addendum)
Piriformis Injection Nerve conduction study ordered See you again in 4-6 weeks

## 2021-03-09 NOTE — Assessment & Plan Note (Signed)
Piriformis.  Injection given today, tolerated procedure relatively well.  We will do use for diagnostic as well as hopefully therapeutic purposes.  If patient responds well we will continue to increase activity as tolerated.  If patient does not we will consider the following for advanced imaging secondary to the weakness noted.

## 2021-03-11 ENCOUNTER — Encounter: Payer: Self-pay | Admitting: Neurology

## 2021-03-11 ENCOUNTER — Encounter: Payer: Self-pay | Admitting: Family Medicine

## 2021-03-19 ENCOUNTER — Encounter: Payer: Self-pay | Admitting: Family Medicine

## 2021-04-01 NOTE — Progress Notes (Signed)
?Charlann Boxer D.O. ?[facility] ?[place] ?Phone: [phone] ?Subjective:   ?I, Vilma Meckel, am serving as a Education administrator for Dr. Hulan Saas. ?This visit occurred during the SARS-CoV-2 public health emergency.  Safety protocols were in place, including screening questions prior to the visit, additional usage of staff PPE, and extensive cleaning of exam room while observing appropriate contact time as indicated for disinfecting solutions.  ? ?I'm seeing this patient by the request  of:  Velna Hatchet, MD ? ?CC: Low back pain and leg pain follow-up ? ?YOV:[id number]  ?03/09/2021 ?Piriformis.  Injection given today, tolerated procedure relatively well.  We will do use for diagnostic as well as hopefully therapeutic purposes.  If patient responds well we will continue to increase activity as tolerated.  If patient does not we will consider the following for advanced imaging secondary to the weakness noted. ? ?Concerned that patient may be having more of a lumbar radiculopathy.  We will get an EMG.  We will consider the possibility of restarting gabapentin depending on his.  Does have some mild atrophy that could be postsurgical.  Patient does have limited range of motion still at the replacement but this could be secondary to muscle tightness.  Patient does have a positive Trendelenburg on the right side and looking somewhat concerning.  Patient repeated again in the 4 to 6 weeks.  At that point we will discuss the possibility of an MRI of the lumbar spine as well as the possibility of a sacroiliac joint injection. ? ?Update 04/06/2021 ?LE FAULCON is a 66 y.o. male coming in with complaint of R hip and back pain. Patient states legs are doing better. LBP has decreased. Wants to know plans for after surgery. Mid back pain when raising arms up, but relief when doing "snow angels" against the wall. Leg pain is gone, but still working on strength. ? ?  ? ?Past Medical History:   ?Diagnosis Date  ? Allergy   ? BPH (benign prostatic hyperplasia)   ? ?Past Surgical History:  ?Procedure Laterality Date  ? COLONOSCOPY  11/30/07  ? NASAL SEPTUM SURGERY    ? ?Social History  ? ?Socioeconomic History  ? Marital status: Married  ?  Spouse name: Not on file  ? Number of children: 3  ? Years of education: Not on file  ? Highest education level: Not on file  ?Occupational History  ? Occupation: Finance  ?Tobacco Use  ? Smoking status: Former  ?  Types: Cigarettes  ?  Quit date: 01/31/2001  ?  Years since quitting: 20.1  ? Smokeless tobacco: Never  ?Vaping Use  ? Vaping Use: Never used  ?Substance and Sexual Activity  ? Alcohol use: Yes  ?  Alcohol/week: 14.0 standard drinks  ?  Types: 14 Cans of beer per week  ? Drug use: No  ? Sexual activity: Not on file  ?Other Topics Concern  ? Not on file  ?Social History Narrative  ? Not on file  ? ?Social Determinants of Health  ? ?Financial Resource Strain: Not on file  ?Food Insecurity: Not on file  ?Transportation Needs: Not on file  ?Physical Activity: Not on file  ?Stress: Not on file  ?Social Connections: Not on file  ? ?No Known Allergies ?Family History  ?Problem Relation Age of Onset  ? Cancer Father 38  ?     cancer  ? Colon cancer Father   ? Heart disease Mother 45  ?  chf-  ? Colon cancer Paternal Grandmother   ? Esophageal cancer Neg Hx   ? Rectal cancer Neg Hx   ? Stomach cancer Neg Hx   ? Colon polyps Neg Hx   ? ? ? ? ? ?Current Outpatient Medications (Analgesics):  ?  ibuprofen (ADVIL,MOTRIN) 200 MG tablet, Take 200 mg by mouth every 6 (six) hours as needed. ? ? ?Current Outpatient Medications (Other):  ?  gabapentin (NEURONTIN) 100 MG capsule, Take 2 capsules (200 mg total) by mouth at bedtime. ?  Misc Natural Products (TART CHERRY ADVANCED PO), Take by mouth. ?  Multiple Vitamins-Minerals (CENTRUM SILVER ULTRA MENS PO), Take 1 tablet by mouth daily. ?  tamsulosin (FLOMAX) 0.4 MG CAPS capsule,  ?  Vitamin D, Cholecalciferol, 50 MCG (2000 UT)  CAPS, Take by mouth. ?  zolpidem (AMBIEN) 10 MG tablet, Take 10 mg by mouth at bedtime as needed for sleep. ? ? ?Reviewed prior external information including notes and imaging from  ?primary care provider ?As well as notes that were available from care everywhere and other healthcare systems. ? ?Past medical history, social, surgical and family history all reviewed in electronic medical record.  No pertanent information unless stated regarding to the chief complaint.  ? ?Review of Systems: ? No headache, visual changes, nausea, vomiting, diarrhea, constipation, dizziness, abdominal pain, skin rash, fevers, chills, night sweats, weight loss, swollen lymph nodes, body aches, joint swelling, chest pain, shortness of breath, mood changes. POSITIVE muscle aches ? ?Objective  ?Blood pressure (!) 142/80, pulse 85, height 5\' 10"  (1.778 m), SpO2 99 %. ?  ?General: No apparent distress alert and oriented x3 mood and affect normal, dressed appropriately.  ?HEENT: Pupils equal, extraocular movements intact  ?Respiratory: Patient's speak in full sentences and does not appear short of breath  ?Cardiovascular: No lower extremity edema, non tender, no erythema  ?Gait normal with good balance and coordination.  ?MSK: Low back exam does show some improvement noted.  Patient does still have some scapular dyskinesis noted.  Seems to be left greater than right.  Patient still has some tightness noted in the right sacroiliac joint compared to the left.  Limited range of motion of the right hip still noted. ? ? ?33354; 15 additional minutes spent for Therapeutic exercises as stated in above notes.  This included exercises focusing on stretching, strengthening, with significant focus on eccentric aspects.   Long term goals include an improvement in range of motion, strength, endurance as well as avoiding reinjury. Patient's frequency would include in 1-2 times a day, 3-5 times a week for a duration of 6-12 weeks. Exercises that included:   ?Basic scapular stabilization to include adduction and depression of scapula ?Scaption, focusing on proper movement and good control ?Internal and External rotation utilizing a theraband, with elbow tucked at side entire time ?Rows with theraband  ? Proper technique shown and discussed handout in great detail with ATC.  All questions were discussed and answered.  ? ?  ?Impression and Recommendations:  ?  ? ?The above documentation has been reviewed and is accurate and complete Lyndal Pulley, DO ? ? ? ?

## 2021-04-06 ENCOUNTER — Other Ambulatory Visit: Payer: Self-pay

## 2021-04-06 ENCOUNTER — Encounter: Payer: Self-pay | Admitting: Family Medicine

## 2021-04-06 ENCOUNTER — Ambulatory Visit (INDEPENDENT_AMBULATORY_CARE_PROVIDER_SITE_OTHER): Payer: Medicare Other | Admitting: Family Medicine

## 2021-04-06 DIAGNOSIS — G2589 Other specified extrapyramidal and movement disorders: Secondary | ICD-10-CM | POA: Diagnosis not present

## 2021-04-06 DIAGNOSIS — M1611 Unilateral primary osteoarthritis, right hip: Secondary | ICD-10-CM | POA: Diagnosis not present

## 2021-04-06 DIAGNOSIS — M5136 Other intervertebral disc degeneration, lumbar region: Secondary | ICD-10-CM

## 2021-04-06 NOTE — Patient Instructions (Signed)
Do prescribed exercises at least 3x a week ?Glad backs doing better ?Ok to jog but never 2 days in a row ?1 min jog and 1 min walk increasing by 1 min every week ?See you again in 6-8 weeks ?

## 2021-04-06 NOTE — Assessment & Plan Note (Signed)
Underlying arthritic changes but doing much better at the moment.  Discussed icing regimen and home exercises.  Patient will make no significant other changes at this time.  Able to start increasing running somewhat. ?

## 2021-04-06 NOTE — Assessment & Plan Note (Signed)
Likely with patient having some atrophy from not being quite as active after the surgical intervention.  Discussed with patient about which activities to do which wants to avoid.  Increase activity slowly.  Follow-up again in 6 to 8 weeks. ?

## 2021-04-06 NOTE — Assessment & Plan Note (Signed)
Known arthritic changes previously.  Still has some limited range of motion.  Continue to have patient responding to the home exercises. ?

## 2021-04-13 ENCOUNTER — Encounter: Payer: 59 | Admitting: Neurology

## 2021-05-14 NOTE — Progress Notes (Signed)
?Charlann Boxer D.O. ?[facility] ?[place] ?Phone: [phone] ?Subjective:   ?I, Judy Pimple, am serving as a scribe for Dr. Hulan Saas. ? ?I'm seeing this patient by the request  of:  Velna Hatchet, MD ? ?CC: low back and hip pain  ? ?[id number]  ?04/06/2021 ?Likely with patient having some atrophy from not being quite as active after the surgical intervention.  Discussed with patient about which activities to do which wants to avoid.  Increase activity slowly.  Follow-up again in 6 to 8 weeks. ? ?Underlying arthritic changes but doing much better at the moment.  Discussed icing regimen and home exercises.  Patient will make no significant other changes at this time.  Able to start increasing running somewhat. ? ?Known arthritic changes previously.  Still has some limited range of motion.  Continue to have patient responding to the home exercises. ? ?Update 05/18/2021 ?PRINCETON NABOR is a 66 y.o. male coming in with complaint of LBP and R hip pain. Patient states that he had been doing very well until he over exerted himself in the yard doing yard work. Patient also was doing some planks and that had made his back sore. ? ? ?  ? ?Past Medical History:  ?Diagnosis Date  ? Allergy   ? BPH (benign prostatic hyperplasia)   ? ?Past Surgical History:  ?Procedure Laterality Date  ? COLONOSCOPY  11/30/07  ? NASAL SEPTUM SURGERY    ? ?Social History  ? ?Socioeconomic History  ? Marital status: Married  ?  Spouse name: Not on file  ? Number of children: 3  ? Years of education: Not on file  ? Highest education level: Not on file  ?Occupational History  ? Occupation: Finance  ?Tobacco Use  ? Smoking status: Former  ?  Types: Cigarettes  ?  Quit date: 01/31/2001  ?  Years since quitting: 20.3  ? Smokeless tobacco: Never  ?Vaping Use  ? Vaping Use: Never used  ?Substance and Sexual Activity  ? Alcohol use: Yes  ?  Alcohol/week: 14.0 standard drinks  ?  Types: 14 Cans of beer per  week  ? Drug use: No  ? Sexual activity: Not on file  ?Other Topics Concern  ? Not on file  ?Social History Narrative  ? Not on file  ? ?Social Determinants of Health  ? ?Financial Resource Strain: Not on file  ?Food Insecurity: Not on file  ?Transportation Needs: Not on file  ?Physical Activity: Not on file  ?Stress: Not on file  ?Social Connections: Not on file  ? ?No Known Allergies ?Family History  ?Problem Relation Age of Onset  ? Cancer Father 81  ?     cancer  ? Colon cancer Father   ? Heart disease Mother 47  ?     chf-  ? Colon cancer Paternal Grandmother   ? Esophageal cancer Neg Hx   ? Rectal cancer Neg Hx   ? Stomach cancer Neg Hx   ? Colon polyps Neg Hx   ? ? ? ? ? ?Current Outpatient Medications (Analgesics):  ?  ibuprofen (ADVIL,MOTRIN) 200 MG tablet, Take 200 mg by mouth every 6 (six) hours as needed. ? ? ?Current Outpatient Medications (Other):  ?  Multiple Vitamins-Minerals (CENTRUM SILVER ULTRA MENS PO), Take 1 tablet by mouth daily. ?  tamsulosin (FLOMAX) 0.4 MG CAPS capsule,  ?  zolpidem (AMBIEN) 10 MG tablet, Take 10 mg by mouth at bedtime as needed for  sleep. ? ? ?Reviewed prior external information including notes and imaging from  ?primary care provider ?As well as notes that were available from care everywhere and other healthcare systems. ? ?Past medical history, social, surgical and family history all reviewed in electronic medical record.  No pertanent information unless stated regarding to the chief complaint.  ? ?Review of Systems: ? No headache, visual changes, nausea, vomiting, diarrhea, constipation, dizziness, abdominal pain, skin rash, fevers, chills, night sweats, weight loss, swollen lymph nodes, body aches, joint swelling, chest pain, shortness of breath, mood changes. POSITIVE muscle aches ? ?Objective  ?Blood pressure 122/82, pulse (!) 51, height '5\' 10"'$  (1.778 m), weight 175 lb (79.4 kg), SpO2 98 %. ?  ?General: No apparent distress alert and oriented x3 mood and affect  normal, dressed appropriately.  ?HEENT: Pupils equal, extraocular movements intact  ?Respiratory: Patient's speak in full sentences and does not appear short of breath  ?Cardiovascular: No lower extremity edema, non tender, no erythema  ?Gait antalgic  ?Patient does have tenderness noted of the lumbar spine on the right greater than left.  Patient does have tightness with FABER test on the right greater than left as well. ?Patient does have tightness noted of the right hip as well that seems to be out of proportion.  This is an internal and external range of motion.  No significant weakness.  Tightness of the piriformis right greater than left ? ?Osteopathic findings ?T6 extended rotated and side bent right ?L1 flexed rotated and side bent right ?Sacrum right on right ?  ?Impression and Recommendations:  ?  ? ?The above documentation has been reviewed and is accurate and complete Lyndal Pulley, DO ? ? ? ?

## 2021-05-18 ENCOUNTER — Ambulatory Visit (INDEPENDENT_AMBULATORY_CARE_PROVIDER_SITE_OTHER): Payer: Medicare Other | Admitting: Family Medicine

## 2021-05-18 VITALS — BP 122/82 | HR 51 | Ht 70.0 in | Wt 175.0 lb

## 2021-05-18 DIAGNOSIS — M9904 Segmental and somatic dysfunction of sacral region: Secondary | ICD-10-CM

## 2021-05-18 DIAGNOSIS — G2589 Other specified extrapyramidal and movement disorders: Secondary | ICD-10-CM | POA: Diagnosis not present

## 2021-05-18 DIAGNOSIS — M545 Low back pain, unspecified: Secondary | ICD-10-CM

## 2021-05-18 DIAGNOSIS — M9903 Segmental and somatic dysfunction of lumbar region: Secondary | ICD-10-CM | POA: Diagnosis not present

## 2021-05-18 DIAGNOSIS — M24559 Contracture, unspecified hip: Secondary | ICD-10-CM

## 2021-05-18 DIAGNOSIS — M9902 Segmental and somatic dysfunction of thoracic region: Secondary | ICD-10-CM

## 2021-05-18 NOTE — Assessment & Plan Note (Signed)
Chronic problem but attempted osteopathic manipulation for this as well as the tightness of the hip flexors noted.  Does have the hip replacements noted bilaterally.  Discussed with patient about icing regimen and home exercises.  Patient will start with formal physical therapy for this chronic problem with exacerbation.  Attempted osteopathic manipulation.  Follow-up again in 6 to 8 weeks. ?

## 2021-05-18 NOTE — Assessment & Plan Note (Signed)
? ?  Decision today to treat with OMT was based on Physical Exam ? ?After verbal consent patient was treated with  ME, FPR techniques in  thoracic, lumbar and sacral areas, all areas are chronic  ? ?Patient tolerated the procedure well with improvement in symptoms ? ?Patient given exercises, stretches and lifestyle modifications ? ?See medications in patient instructions if given ? ?Patient will follow up in 4-8 weeks ?

## 2021-05-18 NOTE — Patient Instructions (Addendum)
Good to see you ?PT at [facility]  ?Follow up in 2 months  ? ?

## 2021-06-01 NOTE — Therapy (Signed)
?OUTPATIENT PHYSICAL THERAPY THORACOLUMBAR EVALUATION ? ? ?Patient Name: Todd [name] ?MRN: [id number] ?DOB:1955-03-07, 66 y.o., male ?Today's Date: 06/02/21 ? ? PT End of Session - 06/02/21 1152   ? ? Visit Number 1   ? Authorization Type Medicare   ? PT Start Time 1148   ? PT Stop Time 1230   ? PT Time Calculation (min) 42 min   ? Activity Tolerance Patient tolerated treatment well   ? ?  ?  ? ?  ? ? ?Past Medical History:  ?Diagnosis Date  ? Allergy   ? BPH (benign prostatic hyperplasia)   ? ?Past Surgical History:  ?Procedure Laterality Date  ? COLONOSCOPY  11/30/07  ? NASAL SEPTUM SURGERY    ? ?Patient Active Problem List  ? Diagnosis Date Noted  ? Somatic dysfunction of spine, lumbar 05/18/2021  ? Scapular dyskinesis 04/06/2021  ? Piriformis syndrome of right side 03/09/2021  ? Osteoarthritis of right hip 06/14/2019  ? Degeneration of lumbar intervertebral disc 05/24/2019  ? Osteoarthritis of left hip 11/27/2018  ? Left leg pain 02/07/2018  ? ? ?PCP: Dr. Velna Hatchet ? ?REFERRING PROVIDER: Dr. Hulan Saas ? ?REFERRING DIAG: M24.559 hip flexor tightness; M54.50 lumbar spine pain ? ?THERAPY DIAG: back pain; weakness ? ? ?ONSET DATE: 01/31/21 ? ?SUBJECTIVE:                                                                                                                                                                                          ? ?SUBJECTIVE STATEMENT: ?The patient complains of lack of hip mobility following his right and left THRs (anterior approach) last year as well as thoracic region pain and frequent cracking even with rolling over in bed.   He is limited in ability to play golf and is unable to carry his golf clubs.   ?PERTINENT HISTORY:  ?Bil anterior THR left in July, right near [place]  ? ?PAIN:  Walking is OK ?Are you having pain? No ?  ?Aggravating factors: golf swing, sitting on a stool without back support; carrying a heavy weight (golf bag)  ?Relieving factors: concerned about doing  stretches  ? ?PRECAUTIONS: None  Discouraged from running  ? ?WEIGHT BEARING RESTRICTIONS No ? ?FALLS:  ?Has patient fallen in last 6 months? No ? ?LIVING ENVIRONMENT: ?Lives with: lives with their family ?Lives in: House/apartment ?OCCUPATION: retired  ? ?PLOF: Independent ? ?PATIENT GOALS  get more flexible especially on the right side;  play better golf;  be active  ? ? ?OBJECTIVE:  ? ? ?PATIENT SURVEYS:  ?FOTO 70% ? ? ?COGNITION: ? Overall cognitive status: Within functional limits for tasks  assessed   ?  ? ? ?MUSCLE LENGTH: ?Hamstrings: Right 60 deg; Left 65 deg ?Thomas test: Right 3 deg; Left 3 deg ? ?POSTURE:  ?Decreased lumbar lordosis, increased thoracic kyphosis ? ?PALPATION: ?Hypomobility thoracic spine PA ? ?LUMBAR ROM:  ? ?Active  A/PROM  ?06/02/2021  ?Flexion WNLs  ?Extension Asymmetrical 10  ?Right lateral flexion Painful 20  ?Left lateral flexion 25  ?Right rotation   ?Left rotation   ? (Blank rows = not tested) ? ?LE ROM:  Decreased right > left hip external rotation: right seated 25 degrees, left 35 ?KTC 95 degrees right and left ? ?LE MMT:  Hip abduction and extension 4+/5 bil;  SLS 8 sec on right, 10 sec on left  ? ?LUMBAR SPECIAL TESTS:  ?Straight leg raise test: Negative ? ?  ? ? ?GAIT: ? ?Comments: Decreased hip extension  ? ? ? ?TODAY'S TREATMENT on eval ?Discussion of areas of focus; doorway hip extension with UE elevation ? ? ?PATIENT EDUCATION:  ?Education details: doorway stretch ?Person educated: Patient ?Education method: Explanation, Demonstration, and Handouts ?Education comprehension: verbalized understanding and returned demonstration ? ? ?HOME EXERCISE PROGRAM: ?Access Code: [email or web address] ?URL: [email or web address] ?Date: 06/02/2021 ?Prepared by: Ruben Im ? ?Exercises ?- Single Arm Doorway Pec Stretch at 120 Degrees Abduction  - 1 x daily - 7 x weekly - 3 sets - 5 reps ? ?ASSESSMENT: ? ?CLINICAL IMPRESSION: ?Patient is a 66 y.o. male who was seen today for physical  therapy evaluation and treatment for thoracic region spine pain and hip flexor tightness.  He had both hips replaced in July and October but feels his hip mobility is still limited (anterior approach) affecting his ability to play golf.  He also has thoracic region discomfort and frequent popping with carrying his golf bag and sitting without back support.  He would benefit from PT to address pain and functional impairments.  ? ? ?OBJECTIVE IMPAIRMENTS decreased ROM, decreased strength, hypomobility, impaired perceived functional ability, and pain.  ? ?ACTIVITY LIMITATIONS community activity and recreational activities .  ? ?PERSONAL FACTORS Time since onset of injury/illness/exacerbation are also affecting patient's functional outcome.  ? ? ?REHAB POTENTIAL: Good ? ?CLINICAL DECISION MAKING: Stable/uncomplicated ? ?EVALUATION COMPLEXITY: Low ? ? ?GOALS: ?Goals reviewed with patient? Yes ? ?SHORT TERM GOALS: Target date: 06/30/2021 ? ?The patient will demonstrate knowledge of basic self care strategies and exercises to promote healing and increased function  ?Baseline: ?Goal status: INITIAL ? ?2.  The patient will report a 40% improvement in hip mobility and thoracic pain levels with functional activities which are currently difficult including carrying his golf bag and sitting without back support  ?Baseline:  ?Goal status: INITIAL ? ? ? ?3. Improved hip external rotation to 35 degrees bil and hip extension to 10 degrees bil needed for playing golf  ? ?LONG TERM GOALS: Target date: 07/28/2021 ? ?The patient will be independent in a safe self progression of a home exercise program to promote further recovery of function   ?Baseline:  ?Goal status: INITIAL ? ?2.  The patient will report a 75% improvement in pain levels with functional activities which are currently difficult including playing golf and prolonged sitting ?Baseline:  ?Goal status: INITIAL ? ?3.  The patient will have improved trunk flexor and extensor  muscle and hip strength strength to at least 5/5 needed for lifting medium to heavier weight objects such as his golf bag  ?Baseline:  ?Goal status: INITIAL ? ?4.  The patient will have improved FOTO score  to    80%   indicating improved function with less pain  ?Baseline:  ?Goal status: INITIAL ? ? ?PLAN: ?PT FREQUENCY: 1-2x/week ? ?PT DURATION: 8 weeks ? ?PLANNED INTERVENTIONS: Therapeutic exercises, Therapeutic activity, Neuromuscular re-education, Balance training, Gait training, Patient/Family education, Joint mobilization, Aquatic Therapy, Dry Needling, Electrical stimulation, Spinal manipulation, Spinal mobilization, Cryotherapy, Moist heat, Taping, Traction, Ultrasound, and Manual therapy. ? ?PLAN FOR NEXT SESSION: thoracic extension and rotation (mobs and active exercise);  hip flexor and spinal muscle lengthening needed for golf; trunk strengthening and LE strengthening in standing ? ?Ruben Im, PT ?06/02/21 2:42 PM ?Phone: [phone] ?Fax: [phone]  ? ? ? ?

## 2021-06-02 ENCOUNTER — Encounter (HOSPITAL_BASED_OUTPATIENT_CLINIC_OR_DEPARTMENT_OTHER): Payer: Self-pay | Admitting: Physical Therapy

## 2021-06-02 ENCOUNTER — Ambulatory Visit (HOSPITAL_BASED_OUTPATIENT_CLINIC_OR_DEPARTMENT_OTHER): Payer: Medicare Other | Attending: Family Medicine | Admitting: Physical Therapy

## 2021-06-02 DIAGNOSIS — M47817 Spondylosis without myelopathy or radiculopathy, lumbosacral region: Secondary | ICD-10-CM | POA: Diagnosis not present

## 2021-06-02 DIAGNOSIS — R531 Weakness: Secondary | ICD-10-CM | POA: Diagnosis not present

## 2021-06-02 DIAGNOSIS — M545 Low back pain, unspecified: Secondary | ICD-10-CM | POA: Insufficient documentation

## 2021-06-02 DIAGNOSIS — M24559 Contracture, unspecified hip: Secondary | ICD-10-CM | POA: Diagnosis present

## 2021-06-05 NOTE — Therapy (Signed)
?OUTPATIENT PHYSICAL THERAPY TREATMENT NOTE ? ? ?Patient Name: Todd [name] ?MRN: [id number] ?DOB:Oct 19, 1955, 66 y.o., male ?Today's Date: 06/08/2021 ? ? ? ?END OF SESSION:  ? PT End of Session - 06/07/21 0947   ? ? Visit Number 2   ? Number of Visits 16   ? Date for PT Re-Evaluation 07/28/21   ? Authorization Type Medicare   ? PT Start Time [phone]   ? PT Stop Time [phone]   ? PT Time Calculation (min) 45 min   ? Activity Tolerance Patient tolerated treatment well   ? Behavior During Therapy [facility] for tasks assessed/performed   ? ?  ?  ? ?  ? ? ?Past Medical History:  ?Diagnosis Date  ? Allergy   ? BPH (benign prostatic hyperplasia)   ? ?Past Surgical History:  ?Procedure Laterality Date  ? COLONOSCOPY  11/30/07  ? NASAL SEPTUM SURGERY    ? ?Patient Active Problem List  ? Diagnosis Date Noted  ? Somatic dysfunction of spine, lumbar 05/18/2021  ? Scapular dyskinesis 04/06/2021  ? Piriformis syndrome of right side 03/09/2021  ? Osteoarthritis of right hip 06/14/2019  ? Degeneration of lumbar intervertebral disc 05/24/2019  ? Osteoarthritis of left hip 11/27/2018  ? Left leg pain 02/07/2018  ? ? ?PCP: Dr. Velna Hatchet ?  ?REFERRING PROVIDER: Dr. Hulan Saas ?  ?REFERRING DIAG: M24.559 hip flexor tightness; M54.50 lumbar spine pain ?  ?THERAPY DIAG: back pain; weakness ?  ?  ?ONSET DATE: 01/31/21 ?  ?SUBJECTIVE:                                                                                                                                                                                          ?  ?SUBJECTIVE STATEMENT: ?Pt denies any adverse effects after prior rx.  Pt reports compliance with HEP. ?Pt states he has less flexibility in R hip though feels clicking on L hip.  Pt reports he has a click in his L anterior hip when lifting L LE. He feels it on every step when ascending stairs.  Pt feels that the click in his L hip radiates into his lumbar.  Pt states his R hip used to click. ?Pt reports he has R sided mid/low thoracic  pain.  Pt states he just started a walk/jog program and did have a little pain.  Pt reports he had R sided SI pain up his back to thoracic.  Pt reports 1-2/10 pain in R sided mid to low thoracic pain and inferior scapula.   ? ?PERTINENT HISTORY:  ?Bil anterior THR left in July, right near [place]  ?  ?PAIN:  Walking is OK ?Are you having pain? yes ?  ?Aggravating factors: golf swing, sitting on a stool without back support; carrying a heavy weight (golf bag)  ?Relieving factors: concerned about doing stretches  ?  ?PRECAUTIONS: None  Discouraged from running  ?  ?WEIGHT BEARING RESTRICTIONS No ?  ?FALLS:  ?Has patient fallen in last 6 months? No ?  ?LIVING ENVIRONMENT: ?Lives with: lives with their family ?Lives in: House/apartment ?OCCUPATION: retired  ?  ?PLOF: Independent ?  ?PATIENT GOALS  get more flexible especially on the right side;  play better golf;  be active  ?  ?  ?OBJECTIVE:  ?Lumbar X rays on 05/29/2020 ?FINDINGS: ?Minimal retrolisthesis of L2-3 is noted secondary to mild ?degenerative disc disease at this level. Mild degenerative disc ?disease is also noted at L1-2 and L5-S1. No fracture is noted. ?  ?IMPRESSION: ?Multilevel degenerative disc disease. No acute abnormality seen in ?the lumbar spine.  ? ?  ?PALPATION: ?Hypomobility thoracic spine PA ?  ?  ?TODAY'S TREATMENT on eval ? ?Therapeutic Exercise: ?Pt performed: ?Rows with TrA with GTB 2x10 reps ?Standing shoulder horizontal abd with RTB with TrA 2x10 reps ? Standing paloff press with GTB with TrA x 10 reps  ?-PT answered Pt's questions. ? ? ?Manual Therapy: ?-Reviewed current function, pain level, response to prior Rx, and HEP compliance. ?-STM and rolling to bilat proximal anterior hip and quad in supine. ?-Pt received mid thoracic Grade II PA jt mobs in prone to improve mobility, tightness, and pain ?  ?  ?  ?PATIENT EDUCATION:  ?Education details: PT answered Pt's questions, rationale of exercises and manual therapy, exercise form ?Person  educated: Patient ?Education method: Explanation, Demonstration, verbal and tactile cues ?Education comprehension: verbalized understanding and returned demonstration, verbal and tactile cues required, needs further instruction ?  ?  ?HOME EXERCISE PROGRAM: ?Access Code: [email or web address] ?URL: [email or web address] ?Date: 06/02/2021 ?Prepared by: Ruben Im ?  ?Exercises ?- Single Arm Doorway Pec Stretch at 120 Degrees Abduction  - 1 x daily - 7 x weekly - 3 sets - 5 reps ?  ?ASSESSMENT: ?  ?CLINICAL IMPRESSION: ?Patient reports having clicking in L hip every time he lifts L LE including performing stairs though states it used to happen with his R hip.  PT performed STM to bilat quads and proximal ant hip.  Pt has soft tissue tightness in R quad and states he can feel it more on his R.  Pt states he does feel better after STM to R hip/quad.  Pt does have hypomobility in thoracic spine and also reports feeling better after thoracic PA jt mobs.  PT educated pt in performing TrA contractions and instructed in correct form with T band exercises.  PT answered Pt's questions and educated pt in [place] and rationale of exercises and MT.  He should benefit from continued skilled PT to address goals and impairments and to improve overall function.  ?  ?  ?OBJECTIVE IMPAIRMENTS decreased ROM, decreased strength, hypomobility, impaired perceived functional ability, and pain.  ?  ?ACTIVITY LIMITATIONS community activity and recreational activities .  ?  ?PERSONAL FACTORS Time since onset of injury/illness/exacerbation are also affecting patient's functional outcome.  ?  ?  ?REHAB POTENTIAL: Good ?  ?CLINICAL DECISION MAKING: Stable/uncomplicated ?  ?EVALUATION COMPLEXITY: Low ?  ?  ?GOALS: ?Goals reviewed with patient? Yes ?  ?SHORT TERM GOALS: Target date: 06/30/2021 ?  ?The patient will demonstrate knowledge of basic self care strategies and exercises to promote healing and  increased function  ?Baseline: ?Goal status:  INITIAL ?  ?2.  The patient will report a 40% improvement in hip mobility and thoracic pain levels with functional activities which are currently difficult including carrying his golf bag and sitting without back support  ?Baseline:  ?Goal status: INITIAL ?  ?  ?  ?3. Improved hip external rotation to 35 degrees bil and hip extension to 10 degrees bil needed for playing golf  ?  ?LONG TERM GOALS: Target date: 07/28/2021 ?  ?The patient will be independent in a safe self progression of a home exercise program to promote further recovery of function   ?Baseline:  ?Goal status: INITIAL ?  ?2.  The patient will report a 75% improvement in pain levels with functional activities which are currently difficult including playing golf and prolonged sitting ?Baseline:  ?Goal status: INITIAL ?  ?3.  The patient will have improved trunk flexor and extensor muscle and hip strength strength to at least 5/5 needed for lifting medium to heavier weight objects such as his golf bag  ?Baseline:  ?Goal status: INITIAL ?  ?4.  The patient will have improved FOTO score to    80%   indicating improved function with less pain  ?Baseline:  ?Goal status: INITIAL ?  ?  ?PLAN: ?PT FREQUENCY: 1-2x/week ?  ?PT DURATION: 8 weeks ?  ?PLANNED INTERVENTIONS: Therapeutic exercises, Therapeutic activity, Neuromuscular re-education, Balance training, Gait training, Patient/Family education, Joint mobilization, Aquatic Therapy, Dry Needling, Electrical stimulation, Spinal manipulation, Spinal mobilization, Cryotherapy, Moist heat, Taping, Traction, Ultrasound, and Manual therapy. ?  ?PLAN FOR NEXT SESSION: Assess response to Rx.  Update HEP next visit. Review TrA contraction.  thoracic extension and rotation (mobs and active exercise); trunk strengthening and LE strengthening in standing ? ? ?Selinda Michaels III PT, DPT ?06/08/21 6:55 AM ? ? ?  ? ?

## 2021-06-07 ENCOUNTER — Ambulatory Visit (HOSPITAL_BASED_OUTPATIENT_CLINIC_OR_DEPARTMENT_OTHER): Payer: Medicare Other | Admitting: Physical Therapy

## 2021-06-07 ENCOUNTER — Encounter (HOSPITAL_BASED_OUTPATIENT_CLINIC_OR_DEPARTMENT_OTHER): Payer: Self-pay | Admitting: Physical Therapy

## 2021-06-07 DIAGNOSIS — M545 Low back pain, unspecified: Secondary | ICD-10-CM

## 2021-06-07 DIAGNOSIS — M6281 Muscle weakness (generalized): Secondary | ICD-10-CM

## 2021-06-07 DIAGNOSIS — M24559 Contracture, unspecified hip: Secondary | ICD-10-CM | POA: Diagnosis not present

## 2021-06-07 DIAGNOSIS — M546 Pain in thoracic spine: Secondary | ICD-10-CM

## 2021-06-10 ENCOUNTER — Encounter (HOSPITAL_BASED_OUTPATIENT_CLINIC_OR_DEPARTMENT_OTHER): Payer: Self-pay | Admitting: Physical Therapy

## 2021-06-10 ENCOUNTER — Ambulatory Visit (HOSPITAL_BASED_OUTPATIENT_CLINIC_OR_DEPARTMENT_OTHER): Payer: Medicare Other | Admitting: Physical Therapy

## 2021-06-10 DIAGNOSIS — M6281 Muscle weakness (generalized): Secondary | ICD-10-CM

## 2021-06-10 DIAGNOSIS — M24559 Contracture, unspecified hip: Secondary | ICD-10-CM | POA: Diagnosis not present

## 2021-06-10 DIAGNOSIS — M546 Pain in thoracic spine: Secondary | ICD-10-CM

## 2021-06-10 DIAGNOSIS — M545 Low back pain, unspecified: Secondary | ICD-10-CM

## 2021-06-10 NOTE — Therapy (Signed)
?OUTPATIENT PHYSICAL THERAPY TREATMENT NOTE ? ? ?Patient Name: Todd [name] ?MRN: [id number] ?DOB:1955-06-08, 66 y.o., male ?Today's Date: 06/10/2021 ? ? ? ?END OF SESSION:  ? PT End of Session - 06/10/21 0846   ? ? Visit Number 3   ? Number of Visits 16   ? Date for PT Re-Evaluation 07/28/21   ? Authorization Type Medicare   ? PT Start Time 0802   ? PT Stop Time 0844   ? PT Time Calculation (min) 42 min   ? Activity Tolerance Patient tolerated treatment well   ? Behavior During Therapy [facility] for tasks assessed/performed   ? ?  ?  ? ?  ? ? ? ?Past Medical History:  ?Diagnosis Date  ? Allergy   ? BPH (benign prostatic hyperplasia)   ? ?Past Surgical History:  ?Procedure Laterality Date  ? COLONOSCOPY  11/30/07  ? NASAL SEPTUM SURGERY    ? ?Patient Active Problem List  ? Diagnosis Date Noted  ? Somatic dysfunction of spine, lumbar 05/18/2021  ? Scapular dyskinesis 04/06/2021  ? Piriformis syndrome of right side 03/09/2021  ? Osteoarthritis of right hip 06/14/2019  ? Degeneration of lumbar intervertebral disc 05/24/2019  ? Osteoarthritis of left hip 11/27/2018  ? Left leg pain 02/07/2018  ? ? ?PCP: Dr. Velna Hatchet ?  ?REFERRING PROVIDER: Dr. Hulan Saas ?  ?REFERRING DIAG: M24.559 hip flexor tightness; M54.50 lumbar spine pain ?  ?THERAPY DIAG: back pain; weakness ?  ?  ?ONSET DATE: 01/31/21 ?  ?SUBJECTIVE:                                                                                                                                                                                          ?  ?SUBJECTIVE STATEMENT: ? ?Pt states that he did not have any extra pain after last session.  ? ?Previous: ?Pt denies any adverse effects after prior rx.  Pt reports compliance with HEP. ?Pt states he has less flexibility in R hip though feels clicking on L hip.  Pt reports he has a click in his L anterior hip when lifting L LE. He feels it on every step when ascending stairs.  Pt feels that the click in his L hip radiates into his  lumbar.  Pt states his R hip used to click. ?Pt reports he has R sided mid/low thoracic pain.  Pt states he just started a walk/jog program and did have a little pain.  Pt reports he had R sided SI pain up his back to thoracic.  Pt reports 1-2/10 pain in R sided mid to low thoracic pain and inferior scapula.   ? ?  PERTINENT HISTORY:  ?Bil anterior THR left in July 2022; R was in Oct 2022, right near [place]  ?  ?PAIN:  Walking is OK ?Are you having pain? No 0/10 ?  ?Aggravating factors: golf swing, sitting on a stool without back support; carrying a heavy weight (golf bag)  ?Relieving factors: concerned about doing stretches  ?  ?PRECAUTIONS: None  Discouraged from running  ?  ?WEIGHT BEARING RESTRICTIONS No ?  ?FALLS:  ?Has patient fallen in last 6 months? No ?  ?LIVING ENVIRONMENT: ?Lives with: lives with their family ?Lives in: House/apartment ?OCCUPATION: retired  ?  ?PLOF: Independent ?  ?PATIENT GOALS  get more flexible especially on the right side;  play better golf;  be active  ?  ?  ?OBJECTIVE:  ?Lumbar X rays on 05/29/2020 ?FINDINGS: ?Minimal retrolisthesis of L2-3 is noted secondary to mild ?degenerative disc disease at this level. Mild degenerative disc ?disease is also noted at L1-2 and L5-S1. No fracture is noted. ?  ?IMPRESSION: ?Multilevel degenerative disc disease. No acute abnormality seen in ?the lumbar spine.  ? ?  ?PALPATION: ?Hypomobility thoracic spine PA ?Bilat T/S paraspinal hypertonicity region of rhomboids  ?  ?  ?TODAY'S TREATMENT on eval ? ?Therapeutic Exercise: ?T/S open book 5s 10x each ?Hip flexor stretch standing 30s 3x ?Lunge position with rotation 2x10 ?GTB Paloff press 2x10 ? ? ?Manual Therapy: ?Bilat hip ext S/L grade IV mob  ?mid thoracic Grade III UPA jt mobs   ? ?  ?PATIENT EDUCATION:  ?Education details: exercise progression, DOMS expectations, muscle firing,  envelope of function, HEP, POC ?Person educated: Patient ?Education method: Explanation, Demonstration, verbal and  tactile cues ?Education comprehension: verbalized understanding and returned demonstration, verbal and tactile cues required, needs further instruction ?  ?  ?HOME EXERCISE PROGRAM: ?Access Code: [email or web address] ?URL: [email or web address] ?Date: 06/02/2021 ?Prepared by: Ruben Im ?  ?Exercises ?- Single Arm Doorway Pec Stretch at 120 Degrees Abduction  - 1 x daily - 7 x weekly - 3 sets - 5 reps ?  ?ASSESSMENT: ?  ?CLINICAL IMPRESSION: ?Pt with bilat hip ext stiffness as well as thoracic rotation stiffness at beginning of session but reports improvement in hip ROM and T/S stiffness following manual therapy and exercise. Pt is largely stiffness and strength limited at this time. Plan to assess for response to mobility exercise at next session and progress to hip and lumbopelvic strength as tolerated. should benefit from continued skilled PT to address goals and impairments and to improve overall function.  ?  ?  ?OBJECTIVE IMPAIRMENTS decreased ROM, decreased strength, hypomobility, impaired perceived functional ability, and pain.  ?  ?ACTIVITY LIMITATIONS community activity and recreational activities .  ?  ?PERSONAL FACTORS Time since onset of injury/illness/exacerbation are also affecting patient's functional outcome.  ?  ?  ?REHAB POTENTIAL: Good ?  ?CLINICAL DECISION MAKING: Stable/uncomplicated ?  ?EVALUATION COMPLEXITY: Low ?  ?  ?GOALS: ?Goals reviewed with patient? Yes ?  ?SHORT TERM GOALS: Target date: 06/30/2021 ?  ?The patient will demonstrate knowledge of basic self care strategies and exercises to promote healing and increased function  ?Baseline: ?Goal status: INITIAL ?  ?2.  The patient will report a 40% improvement in hip mobility and thoracic pain levels with functional activities which are currently difficult including carrying his golf bag and sitting without back support  ?Baseline:  ?Goal status: INITIAL ?  ?  ?  ?3. Improved hip external rotation to 35 degrees bil and hip extension to  10 degrees  bil needed for playing golf  ?  ?LONG TERM GOALS: Target date: 07/28/2021 ?  ?The patient will be independent in a safe self progression of a home exercise program to promote further recovery of function   ?Baseline:  ?Goal status: INITIAL ?  ?2.  The patient will report a 75% improvement in pain levels with functional activities which are currently difficult including playing golf and prolonged sitting ?Baseline:  ?Goal status: INITIAL ?  ?3.  The patient will have improved trunk flexor and extensor muscle and hip strength strength to at least 5/5 needed for lifting medium to heavier weight objects such as his golf bag  ?Baseline:  ?Goal status: INITIAL ?  ?4.  The patient will have improved FOTO score to    80%   indicating improved function with less pain  ?Baseline:  ?Goal status: INITIAL ?  ?  ?PLAN: ?PT FREQUENCY: 1-2x/week ?  ?PT DURATION: 8 weeks ?  ?PLANNED INTERVENTIONS: Therapeutic exercises, Therapeutic activity, Neuromuscular re-education, Balance training, Gait training, Patient/Family education, Joint mobilization, Aquatic Therapy, Dry Needling, Electrical stimulation, Spinal manipulation, Spinal mobilization, Cryotherapy, Moist heat, Taping, Traction, Ultrasound, and Manual therapy. ?  ?PLAN FOR NEXT SESSION: Assess response to Rx.  Update HEP next visit. Review TrA contraction.  thoracic extension and rotation (mobs and active exercise); trunk strengthening and LE strengthening in standing ? ?Daleen Bo PT, DPT ?06/10/21 8:47 AM ? ? ?

## 2021-06-14 ENCOUNTER — Ambulatory Visit (HOSPITAL_BASED_OUTPATIENT_CLINIC_OR_DEPARTMENT_OTHER): Payer: Medicare Other | Admitting: Physical Therapy

## 2021-06-14 ENCOUNTER — Encounter (HOSPITAL_BASED_OUTPATIENT_CLINIC_OR_DEPARTMENT_OTHER): Payer: Self-pay | Admitting: Physical Therapy

## 2021-06-14 DIAGNOSIS — M6281 Muscle weakness (generalized): Secondary | ICD-10-CM

## 2021-06-14 DIAGNOSIS — M24559 Contracture, unspecified hip: Secondary | ICD-10-CM | POA: Diagnosis not present

## 2021-06-14 DIAGNOSIS — M5459 Other low back pain: Secondary | ICD-10-CM

## 2021-06-14 DIAGNOSIS — M546 Pain in thoracic spine: Secondary | ICD-10-CM

## 2021-06-14 NOTE — Therapy (Signed)
?OUTPATIENT PHYSICAL THERAPY TREATMENT NOTE ? ? ?Patient Name: Todd [name] ?MRN: [id number] ?DOB:December 29, 1955, 66 y.o., male ?Today's Date: 06/14/2021 ? ? ? ?END OF SESSION:  ? PT End of Session - 06/14/21 1015   ? ? Visit Number 4   ? Number of Visits 16   ? Date for PT Re-Evaluation 07/28/21   ? Authorization Type Medicare   ? PT Start Time 1016   ? PT Stop Time 1055   ? PT Time Calculation (min) 39 min   ? Activity Tolerance Patient tolerated treatment well   ? Behavior During Therapy [facility] for tasks assessed/performed   ? ?  ?  ? ?  ? ? ? ? ?Past Medical History:  ?Diagnosis Date  ? Allergy   ? BPH (benign prostatic hyperplasia)   ? ?Past Surgical History:  ?Procedure Laterality Date  ? COLONOSCOPY  11/30/07  ? NASAL SEPTUM SURGERY    ? ?Patient Active Problem List  ? Diagnosis Date Noted  ? Somatic dysfunction of spine, lumbar 05/18/2021  ? Scapular dyskinesis 04/06/2021  ? Piriformis syndrome of right side 03/09/2021  ? Osteoarthritis of right hip 06/14/2019  ? Degeneration of lumbar intervertebral disc 05/24/2019  ? Osteoarthritis of left hip 11/27/2018  ? Left leg pain 02/07/2018  ? ? ?PCP: Dr. Velna Hatchet ?  ?REFERRING PROVIDER: Dr. Hulan Saas ?  ?REFERRING DIAG: M24.559 hip flexor tightness; M54.50 lumbar spine pain ?  ?THERAPY DIAG: back pain; weakness ?  ?  ?ONSET DATE: 01/31/21 ?  ?SUBJECTIVE:                                                                                                                                                                                          ?  ?SUBJECTIVE STATEMENT: ? ?Pt states that he did not have any extra pain after last session. He continues to have T/S pain and lumbar pain but he states it is not as high as it was previously. He was able to hit balls without significant issue.  ? ?Previous: ?Pt denies any adverse effects after prior rx.  Pt reports compliance with HEP. ?Pt states he has less flexibility in R hip though feels clicking on L hip.  Pt reports he has a  click in his L anterior hip when lifting L LE. He feels it on every step when ascending stairs.  Pt feels that the click in his L hip radiates into his lumbar.  Pt states his R hip used to click. ?Pt reports he has R sided mid/low thoracic pain.  Pt states he just started a walk/jog program and did have a little pain.  Pt reports he had R sided SI pain up his back to thoracic.  Pt reports 1-2/10 pain in R sided mid to low thoracic pain and inferior scapula.   ? ?PERTINENT HISTORY:  ?Bil anterior THR left in July 2022; R was in Oct 2022, right near [place]  ?  ?PAIN:  Walking is OK ?Are you having pain? Yes 2/10 ?  ?Aggravating factors: golf swing, sitting on a stool without back support; carrying a heavy weight (golf bag)  ?Relieving factors: concerned about doing stretches  ?  ?PRECAUTIONS: None  Discouraged from running  ?  ?WEIGHT BEARING RESTRICTIONS No ?  ?FALLS:  ?Has patient fallen in last 6 months? No ?  ?LIVING ENVIRONMENT: ?Lives with: lives with their family ?Lives in: House/apartment ?OCCUPATION: retired  ?  ?PLOF: Independent ?  ?PATIENT GOALS  get more flexible especially on the right side;  play better golf;  be active  ?  ?  ?OBJECTIVE:  ?Lumbar X rays on 05/29/2020 ?FINDINGS: ?Minimal retrolisthesis of L2-3 is noted secondary to mild ?degenerative disc disease at this level. Mild degenerative disc ?disease is also noted at L1-2 and L5-S1. No fracture is noted. ?  ?IMPRESSION: ?Multilevel degenerative disc disease. No acute abnormality seen in ?the lumbar spine.  ? ?  ?PALPATION: ?Hypomobility thoracic spine PA ?Bilat T/S paraspinal hypertonicity region of rhomboids  ?  ?  ?TODAY'S TREATMENT on eval ? ?Therapeutic Exercise: ?T/S ext over foam roll 5s 10x ?Hip flexor stretch kneeling with SB 30s 3x ?Prone quad stretch ?SLR 2x10 on L ? ? ?Manual Therapy: ?STM L VL, iliopsoas release  ?L1-5 Grade III CPA jt mobs   ? ?  ?PATIENT EDUCATION:  ?Education details: exercise progression, DOMS expectations,  muscle firing,  envelope of function, HEP, POC ?Person educated: Patient ?Education method: Explanation, Demonstration, verbal and tactile cues ?Education comprehension: verbalized understanding and returned demonstration, verbal and tactile cues required, needs further instruction ?  ?  ?HOME EXERCISE PROGRAM: ?Access Code: [email or web address] ?URL: [email or web address] ?Date: 06/02/2021 ?Prepared by: Ruben Im ?  ?Exercises ?- Single Arm Doorway Pec Stretch at 120 Degrees Abduction  - 1 x daily - 7 x weekly - 3 sets - 5 reps ?  ?ASSESSMENT: ?  ?CLINICAL IMPRESSION: ?Pt able to reduce clicking in the anterior hip at today's session with stretching and exercise. Pt does demonstrates decreased L hip flexor strength at this time that could be contributing to "stuck" feeling pt has with standing hip flexion. Pt with good tolerance to self stretching for the anterior thigh and thoracic mobility. Plan to continue with L hip flexor strength and T/S and lumbar mobility as tolerated at next session. Consider banding hip flexor march and adding T/S extension in chair for home. Pt should benefit from continued skilled PT to address goals and impairments and to improve overall function.  ?  ?  ?OBJECTIVE IMPAIRMENTS decreased ROM, decreased strength, hypomobility, impaired perceived functional ability, and pain.  ?  ?ACTIVITY LIMITATIONS community activity and recreational activities .  ?  ?PERSONAL FACTORS Time since onset of injury/illness/exacerbation are also affecting patient's functional outcome.  ?  ?  ?REHAB POTENTIAL: Good ?  ?CLINICAL DECISION MAKING: Stable/uncomplicated ?  ?EVALUATION COMPLEXITY: Low ?  ?  ?GOALS: ?Goals reviewed with patient? Yes ?  ?SHORT TERM GOALS: Target date: 06/30/2021 ?  ?The patient will demonstrate knowledge of basic self care strategies and exercises to promote healing and increased function  ?Baseline: ?Goal status: INITIAL ?  ?2.  The patient will  report a 40% improvement in hip  mobility and thoracic pain levels with functional activities which are currently difficult including carrying his golf bag and sitting without back support  ?Baseline:  ?Goal status: INITIAL ?  ?  ?  ?3. Improved hip external rotation to 35 degrees bil and hip extension to 10 degrees bil needed for playing golf  ?  ?LONG TERM GOALS: Target date: 07/28/2021 ?  ?The patient will be independent in a safe self progression of a home exercise program to promote further recovery of function   ?Baseline:  ?Goal status: INITIAL ?  ?2.  The patient will report a 75% improvement in pain levels with functional activities which are currently difficult including playing golf and prolonged sitting ?Baseline:  ?Goal status: INITIAL ?  ?3.  The patient will have improved trunk flexor and extensor muscle and hip strength strength to at least 5/5 needed for lifting medium to heavier weight objects such as his golf bag  ?Baseline:  ?Goal status: INITIAL ?  ?4.  The patient will have improved FOTO score to    80%   indicating improved function with less pain  ?Baseline:  ?Goal status: INITIAL ?  ?  ?PLAN: ?PT FREQUENCY: 1-2x/week ?  ?PT DURATION: 8 weeks ?  ?PLANNED INTERVENTIONS: Therapeutic exercises, Therapeutic activity, Neuromuscular re-education, Balance training, Gait training, Patient/Family education, Joint mobilization, Aquatic Therapy, Dry Needling, Electrical stimulation, Spinal manipulation, Spinal mobilization, Cryotherapy, Moist heat, Taping, Traction, Ultrasound, and Manual therapy. ?  ?PLAN FOR NEXT SESSION: thoracic extension and rotation (mobs and active exercise); trunk strengthening and hip flexor strengthening in standing ? ?Daleen Bo PT, DPT ?06/14/21 11:00 AM ? ? ?

## 2021-06-17 ENCOUNTER — Ambulatory Visit (HOSPITAL_BASED_OUTPATIENT_CLINIC_OR_DEPARTMENT_OTHER): Payer: Medicare Other | Admitting: Physical Therapy

## 2021-06-17 ENCOUNTER — Encounter (HOSPITAL_BASED_OUTPATIENT_CLINIC_OR_DEPARTMENT_OTHER): Payer: Self-pay | Admitting: Physical Therapy

## 2021-06-17 DIAGNOSIS — M6281 Muscle weakness (generalized): Secondary | ICD-10-CM

## 2021-06-17 DIAGNOSIS — M5459 Other low back pain: Secondary | ICD-10-CM

## 2021-06-17 DIAGNOSIS — M24559 Contracture, unspecified hip: Secondary | ICD-10-CM | POA: Diagnosis not present

## 2021-06-17 DIAGNOSIS — M546 Pain in thoracic spine: Secondary | ICD-10-CM

## 2021-06-17 NOTE — Therapy (Signed)
OUTPATIENT PHYSICAL THERAPY TREATMENT NOTE   Patient Name: Todd [name] MRN: [id number] DOB:1955-06-03, 66 y.o., male Today's Date: 06/17/2021    END OF SESSION:   PT End of Session - 06/17/21 0853     Visit Number 5    Number of Visits 16    Date for PT Re-Evaluation 07/28/21    Authorization Type Medicare    PT Start Time 0803    PT Stop Time 0845    PT Time Calculation (min) 42 min    Activity Tolerance Patient tolerated treatment well    Behavior During Therapy [facility] for tasks assessed/performed                Past Medical History:  Diagnosis Date   Allergy    BPH (benign prostatic hyperplasia)    Past Surgical History:  Procedure Laterality Date   COLONOSCOPY  11/30/07   NASAL SEPTUM SURGERY     Patient Active Problem List   Diagnosis Date Noted   Somatic dysfunction of spine, lumbar 05/18/2021   Scapular dyskinesis 04/06/2021   Piriformis syndrome of right side 03/09/2021   Osteoarthritis of right hip 06/14/2019   Degeneration of lumbar intervertebral disc 05/24/2019   Osteoarthritis of left hip 11/27/2018   Left leg pain 02/07/2018    PCP: Dr. Velna Hatchet   REFERRING PROVIDER: Dr. Hulan Saas   REFERRING DIAG: X10.626 hip flexor tightness; M54.50 lumbar spine pain   THERAPY DIAG: back pain; weakness     ONSET DATE: 01/31/21   SUBJECTIVE:                                                                                                                                                                                            SUBJECTIVE STATEMENT:  Pt states that it still feels like "locking" into the hip. He did not have any extra pain after last session. Pt states that he went to go hip golf balls and has dull pain in the middle of his back.   Previous: Pt denies any adverse effects after prior rx.  Pt reports compliance with HEP. Pt states he has less flexibility in R hip though feels clicking on L hip.  Pt reports he has a click in his L  anterior hip when lifting L LE. He feels it on every step when ascending stairs.  Pt feels that the click in his L hip radiates into his lumbar.  Pt states his R hip used to click. Pt reports he has R sided mid/low thoracic pain.  Pt states he just started a walk/jog program and did have a little pain.  Pt  reports he had R sided SI pain up his back to thoracic.  Pt reports 1-2/10 pain in R sided mid to low thoracic pain and inferior scapula.    PERTINENT HISTORY:  Bil anterior THR left in July 2022; R was in Oct 2022, right near Halloween    PAIN:  Walking is OK Are you having pain? Yes 2/10   Aggravating factors: golf swing, sitting on a stool without back support; carrying a heavy weight (golf bag)  Relieving factors: concerned about doing stretches    PRECAUTIONS: None  Discouraged from running    WEIGHT BEARING RESTRICTIONS No   FALLS:  Has patient fallen in last 6 months? No   LIVING ENVIRONMENT: Lives with: lives with their family Lives in: House/apartment OCCUPATION: retired    PLOF: Independent   PATIENT GOALS  get more flexible especially on the right side;  play better golf;  be active      OBJECTIVE:  Lumbar X rays on 05/29/2020 FINDINGS: Minimal retrolisthesis of L2-3 is noted secondary to mild degenerative disc disease at this level. Mild degenerative disc disease is also noted at L1-2 and L5-S1. No fracture is noted.   IMPRESSION: Multilevel degenerative disc disease. No acute abnormality seen in the lumbar spine.      TODAY'S TREATMENT on eval  Therapeutic Exercise: T/S ext over foam roll 5s 10x Supine 90/90 hip flexor march 3x10 RTB loop at feet SLR 2x10 on L Golbet squat to table 15lbs 3x10 Wall angel 2x10 Half knee wall T/S rotation and ext 2x10  Manual Therapy: T3-45Grade III UPA jt mobs  CTJ seated manip     PATIENT EDUCATION:  Education details: exercise progression, DOMS expectations, muscle firing,  envelope of function, HEP, POC Person  educated: Patient Education method: Explanation, Demonstration, verbal and tactile cues Education comprehension: verbalized understanding and returned demonstration, verbal and tactile cues required, needs further instruction     HOME EXERCISE PROGRAM: Access Code: [email or web address] URL: [email or web address] Date: 06/17/2021 Prepared by: Daleen Bo  Exercises - Prone Quadriceps Stretch with Strap  - 2 x daily - 7 x weekly - 1 sets - 3 reps - 30 hold - Half Kneeling Hip Flexor Stretch with Sidebend  - 2 x daily - 7 x weekly - 1 sets - 3 reps - 30 hold - Forward Lunge with Rotation  - 1 x daily - 3-4 x weekly - 1 sets - 10 reps - 2 hold - Thoracic Extension Mobilization on Foam Roll  - 1 x daily - 3-4 x weekly - 1 sets - 10 reps - 5 hold - Wall Angels  - 1 x daily - 3-4 x weekly - 2 sets - 10 reps - Goblet Squat with Kettlebell  - 1 x daily - 3-4 x weekly - 3 sets - 10 reps - Supine 90/90 with Leg Extensions  - 1 x daily - 3-4 x weekly - 3 sets - 10 reps  ASSESSMENT:   CLINICAL IMPRESSION: Pt able to continue with hip flexor and lumbopelvic strengthening at today's session as well as progression of T/S mobility. Pt had positive response to joint mobilizations about the area of the R rhomboid. Pain seems consistent with soft tissue restriction in that region. HEP updated at this time to promote more T/S mobility as well as LE strength. Consider DN PRN if needed as well as progression of generalized L LE strengthening as tolerated for next session. Pt should benefit from continued skilled PT to address goals and impairments and to  improve overall function.      OBJECTIVE IMPAIRMENTS decreased ROM, decreased strength, hypomobility, impaired perceived functional ability, and pain.    ACTIVITY LIMITATIONS community activity and recreational activities .    PERSONAL FACTORS Time since onset of injury/illness/exacerbation are also affecting patient's functional outcome.      REHAB  POTENTIAL: Good   CLINICAL DECISION MAKING: Stable/uncomplicated   EVALUATION COMPLEXITY: Low     GOALS: Goals reviewed with patient? Yes   SHORT TERM GOALS: Target date: 06/30/2021   The patient will demonstrate knowledge of basic self care strategies and exercises to promote healing and increased function  Baseline: Goal status: INITIAL   2.  The patient will report a 40% improvement in hip mobility and thoracic pain levels with functional activities which are currently difficult including carrying his golf bag and sitting without back support  Baseline:  Goal status: INITIAL       3. Improved hip external rotation to 35 degrees bil and hip extension to 10 degrees bil needed for playing golf    LONG TERM GOALS: Target date: 07/28/2021   The patient will be independent in a safe self progression of a home exercise program to promote further recovery of function   Baseline:  Goal status: INITIAL   2.  The patient will report a 75% improvement in pain levels with functional activities which are currently difficult including playing golf and prolonged sitting Baseline:  Goal status: INITIAL   3.  The patient will have improved trunk flexor and extensor muscle and hip strength strength to at least 5/5 needed for lifting medium to heavier weight objects such as his golf bag  Baseline:  Goal status: INITIAL   4.  The patient will have improved FOTO score to    80%   indicating improved function with less pain  Baseline:  Goal status: INITIAL     PLAN: PT FREQUENCY: 1-2x/week   PT DURATION: 8 weeks   PLANNED INTERVENTIONS: Therapeutic exercises, Therapeutic activity, Neuromuscular re-education, Balance training, Gait training, Patient/Family education, Joint mobilization, Aquatic Therapy, Dry Needling, Electrical stimulation, Spinal manipulation, Spinal mobilization, Cryotherapy, Moist heat, Taping, Traction, Ultrasound, and Manual therapy.   PLAN FOR NEXT SESSION: thoracic  extension and rotation (mobs and active exercise); bird dog, step up with weight, sidestepping with resistance  Daleen Bo PT, DPT 06/17/21 9:06 AM

## 2021-06-23 ENCOUNTER — Ambulatory Visit (HOSPITAL_BASED_OUTPATIENT_CLINIC_OR_DEPARTMENT_OTHER): Payer: Medicare Other | Admitting: Physical Therapy

## 2021-06-23 ENCOUNTER — Encounter (HOSPITAL_BASED_OUTPATIENT_CLINIC_OR_DEPARTMENT_OTHER): Payer: Self-pay | Admitting: Physical Therapy

## 2021-06-23 DIAGNOSIS — M5459 Other low back pain: Secondary | ICD-10-CM

## 2021-06-23 DIAGNOSIS — M546 Pain in thoracic spine: Secondary | ICD-10-CM

## 2021-06-23 DIAGNOSIS — M24559 Contracture, unspecified hip: Secondary | ICD-10-CM | POA: Diagnosis not present

## 2021-06-23 DIAGNOSIS — M6281 Muscle weakness (generalized): Secondary | ICD-10-CM

## 2021-06-23 NOTE — Therapy (Addendum)
OUTPATIENT PHYSICAL THERAPY TREATMENT NOTE  PHYSICAL THERAPY DISCHARGE SUMMARY  Visits from Start of Care: 6  Plan: Patient agrees to discharge.  Patient goals were not met. Patient is being discharged due to not returning to therapy.       Patient Name: Todd [name] MRN: [id number] DOB:1955/09/18, 66 y.o., male Today's Date: 06/23/2021    END OF SESSION:   PT End of Session - 06/23/21 0930     Visit Number 6    Number of Visits 16    Date for PT Re-Evaluation 07/28/21    Authorization Type Medicare    PT Start Time 0930    PT Stop Time 1010    PT Time Calculation (min) 40 min    Activity Tolerance Patient tolerated treatment well    Behavior During Therapy [facility] for tasks assessed/performed                 Past Medical History:  Diagnosis Date   Allergy    BPH (benign prostatic hyperplasia)    Past Surgical History:  Procedure Laterality Date   COLONOSCOPY  11/30/07   NASAL SEPTUM SURGERY     Patient Active Problem List   Diagnosis Date Noted   Somatic dysfunction of spine, lumbar 05/18/2021   Scapular dyskinesis 04/06/2021   Piriformis syndrome of right side 03/09/2021   Osteoarthritis of right hip 06/14/2019   Degeneration of lumbar intervertebral disc 05/24/2019   Osteoarthritis of left hip 11/27/2018   Left leg pain 02/07/2018    PCP: Dr. Velna Hatchet   REFERRING PROVIDER: Dr. Hulan Saas   REFERRING DIAG: I95.188 hip flexor tightness; M54.50 lumbar spine pain   THERAPY DIAG: back pain; weakness     ONSET DATE: 01/31/21   SUBJECTIVE:                                                                                                                                                                                            SUBJECTIVE STATEMENT:  Pt states things are better. He feels that stretching is helping. The popping clicking is still happening but it is better than before. He has had some trouble with the 90/90 hip flexor exercise. Pt  reports he will be going down to [facility] [place] soon for an extended period of time.   Previous: Pt denies any adverse effects after prior rx.  Pt reports compliance with HEP. Pt states he has less flexibility in R hip though feels clicking on L hip.  Pt reports he has a click in his L anterior hip when lifting L LE. He feels it on every step when ascending stairs.  Pt feels that the click in his L hip radiates into his lumbar.  Pt states his R hip used to click. Pt reports he has R sided mid/low thoracic pain.  Pt states he just started a walk/jog program and did have a little pain.  Pt reports he had R sided SI pain up his back to thoracic.  Pt reports 1-2/10 pain in R sided mid to low thoracic pain and inferior scapula.    PERTINENT HISTORY:  Bil anterior THR left in July 2022; R was in Oct 2022, right near Halloween    PAIN:  Walking is OK Are you having pain? Yes 2/10   Aggravating factors: golf swing, sitting on a stool without back support; carrying a heavy weight (golf bag)  Relieving factors: concerned about doing stretches    PRECAUTIONS: None  Discouraged from running    WEIGHT BEARING RESTRICTIONS No   FALLS:  Has patient fallen in last 6 months? No   LIVING ENVIRONMENT: Lives with: lives with their family Lives in: House/apartment OCCUPATION: retired    PLOF: Independent   PATIENT GOALS  get more flexible especially on the right side;  play better golf;  be active      OBJECTIVE:  Lumbar X rays on 05/29/2020 FINDINGS: Minimal retrolisthesis of L2-3 is noted secondary to mild degenerative disc disease at this level. Mild degenerative disc disease is also noted at L1-2 and L5-S1. No fracture is noted.   IMPRESSION: Multilevel degenerative disc disease. No acute abnormality seen in the lumbar spine.   FOTO Eval 70% FOTO 6th Visit    TODAY'S TREATMENT on eval  Therapeutic Exercise:  Review of wall angel and golbet squatting Supine 90/90 hip flexor march 3x10 RTB loop  at feet KB lateral swing 10-15lbs 2x10 10-15llb RDL 3x10 [place] with rotation Blue TB 2x10 each way      PATIENT EDUCATION:  Education details: HEP progression/update,exercise progression, DOMS expectations, muscle firing,  envelope of function, HEP, POC Person educated: Patient Education method: Explanation, Demonstration, verbal and tactile cues Education comprehension: verbalized understanding and returned demonstration, verbal and tactile cues required, needs further instruction     HOME EXERCISE PROGRAM: Access Code: [email or web address] URL: [email or web address] Date: 06/17/2021 Prepared by: Daleen Bo  Exercises - Prone Quadriceps Stretch with Strap  - 2 x daily - 7 x weekly - 1 sets - 3 reps - 30 hold - Half Kneeling Hip Flexor Stretch with Sidebend  - 2 x daily - 7 x weekly - 1 sets - 3 reps - 30 hold - Forward Lunge with Rotation  - 1 x daily - 3-4 x weekly - 1 sets - 10 reps - 2 hold - Thoracic Extension Mobilization on Foam Roll  - 1 x daily - 3-4 x weekly - 1 sets - 10 reps - 5 hold - Wall Angels  - 1 x daily - 3-4 x weekly - 2 sets - 10 reps - Goblet Squat with Kettlebell  - 1 x daily - 3-4 x weekly - 3 sets - 10 reps - Supine 90/90 with Leg Extensions  - 1 x daily - 3-4 x weekly - 3 sets - 10 reps  ASSESSMENT:   CLINICAL IMPRESSION: Pt with improved functional mobility at today's session in the T/S region and able to continue with strength progression of bilateral hips. Pt did require VC and TC for hip hinging technique and connected trunk and lumbopelvic rotation but did not have increased hip or T/S pain with resisted rotational movements. Pt  appears to have good understanding of HEP progression and self gym progression. Pt to schedule as able moving forward as he will be traveling in and out of town. Pt has progressed well with therapy. Plan to continue with lumboplevic strength and general mobility as tolerated at future sessions.  Pt should benefit from  continued skilled PT to address goals and impairments and to improve overall function.      OBJECTIVE IMPAIRMENTS decreased ROM, decreased strength, hypomobility, impaired perceived functional ability, and pain.    ACTIVITY LIMITATIONS community activity and recreational activities .    PERSONAL FACTORS Time since onset of injury/illness/exacerbation are also affecting patient's functional outcome.      REHAB POTENTIAL: Good   CLINICAL DECISION MAKING: Stable/uncomplicated   EVALUATION COMPLEXITY: Low     GOALS: Goals reviewed with patient? Yes   SHORT TERM GOALS: Target date: 06/30/2021   The patient will demonstrate knowledge of basic self care strategies and exercises to promote healing and increased function  Baseline: Goal status: INITIAL   2.  The patient will report a 40% improvement in hip mobility and thoracic pain levels with functional activities which are currently difficult including carrying his golf bag and sitting without back support  Baseline:  Goal status: INITIAL       3. Improved hip external rotation to 35 degrees bil and hip extension to 10 degrees bil needed for playing golf    LONG TERM GOALS: Target date: 07/28/2021   The patient will be independent in a safe self progression of a home exercise program to promote further recovery of function   Baseline:  Goal status: INITIAL   2.  The patient will report a 75% improvement in pain levels with functional activities which are currently difficult including playing golf and prolonged sitting Baseline:  Goal status: INITIAL   3.  The patient will have improved trunk flexor and extensor muscle and hip strength strength to at least 5/5 needed for lifting medium to heavier weight objects such as his golf bag  Baseline:  Goal status: INITIAL   4.  The patient will have improved FOTO score to    80%   indicating improved function with less pain  Baseline:  Goal status: INITIAL     PLAN: PT FREQUENCY:  1-2x/week   PT DURATION: 8 weeks   PLANNED INTERVENTIONS: Therapeutic exercises, Therapeutic activity, Neuromuscular re-education, Balance training, Gait training, Patient/Family education, Joint mobilization, Aquatic Therapy, Dry Needling, Electrical stimulation, Spinal manipulation, Spinal mobilization, Cryotherapy, Moist heat, Taping, Traction, Ultrasound, and Manual therapy.   PLAN FOR NEXT SESSION: thoracic extension and rotation (mobs and active exercise); bird dog, step up with weight  Daleen Bo PT, DPT 06/23/21 10:19 AM

## 2021-07-22 ENCOUNTER — Ambulatory Visit: Payer: Medicare Other | Admitting: Family Medicine

## 2022-02-11 IMAGING — CT CT CARDIAC CORONARY ARTERY CALCIUM SCORE
3 series · 14 of 20 positions shown, 16 images · non-contrast
Comparison: None.

CLINICAL DATA: 65-year-old Caucasian male with history of
hypertension.

EXAM:
CT CARDIAC CORONARY ARTERY CALCIUM SCORE
TECHNIQUE: Non-contrast imaging through the heart was performed using
prospective ECG gating. Image post processing was performed on an
independent workstation, allowing for quantitative analysis of the
heart and coronary arteries. Note that this exam targets the heart
and the chest was not imaged in its entirety.

[Series 2: calcium scoring 2.00 qr36 bestdiast 70% hrt calciu · axial · 0.41mm/px · z∈[+1508,+1596]mm · 4 of 74 slices shown]
[im 15/74  vessel]
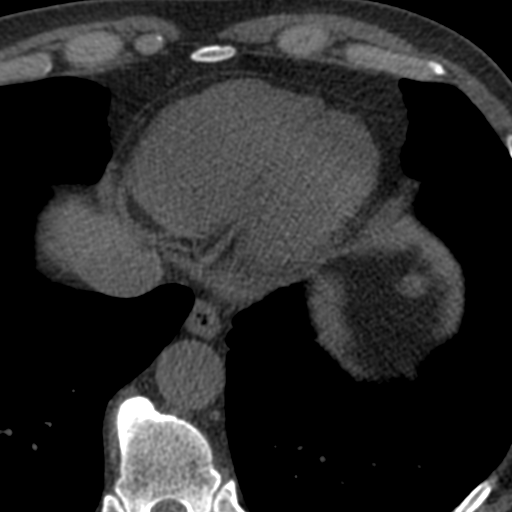
[im 30/74  vessel]
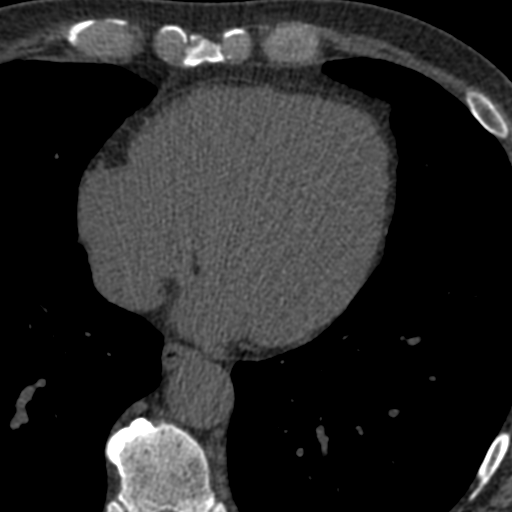
[im 44/74  vessel]
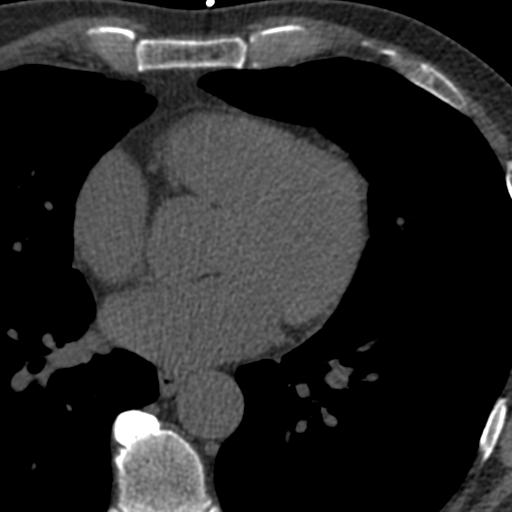
[im 59/74  vessel]
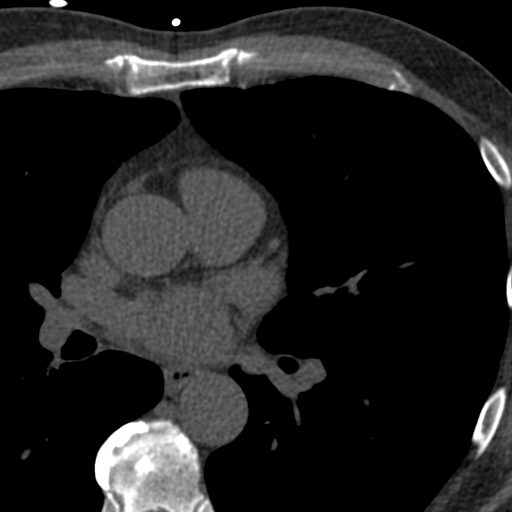

[Series 3: calcium scoring 2.00 br40 bestdiast 70% axial · axial · 0.59mm/px · z∈[+1504,+1600]mm · 5 of 74 slices shown, 7 images]
[im 13/74  vessel]
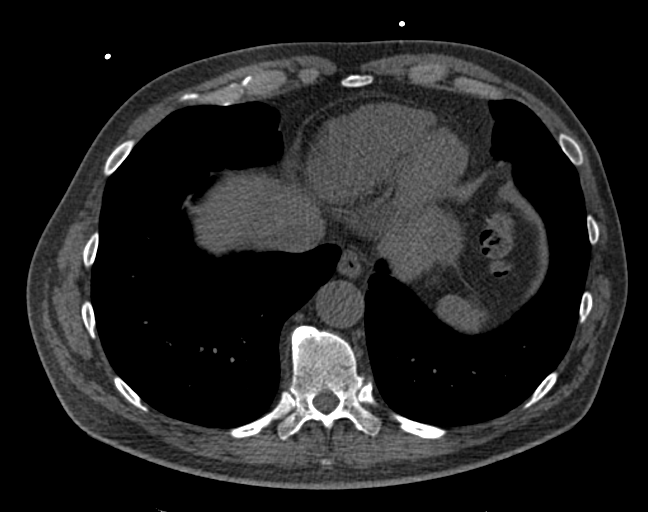
[im 13/74  lung]
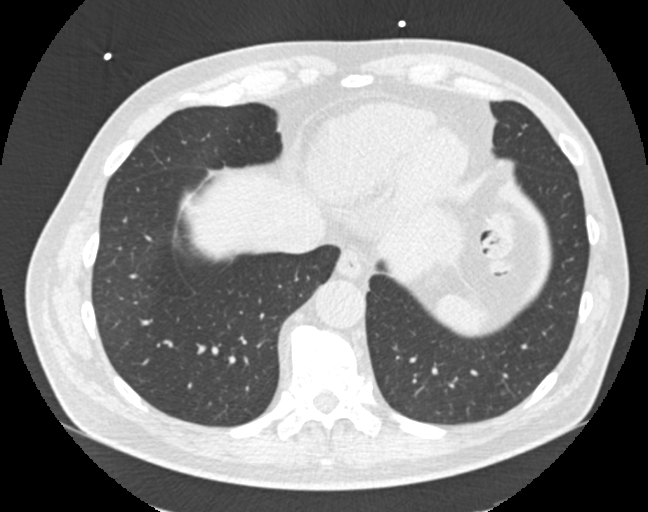
[im 25/74  vessel]
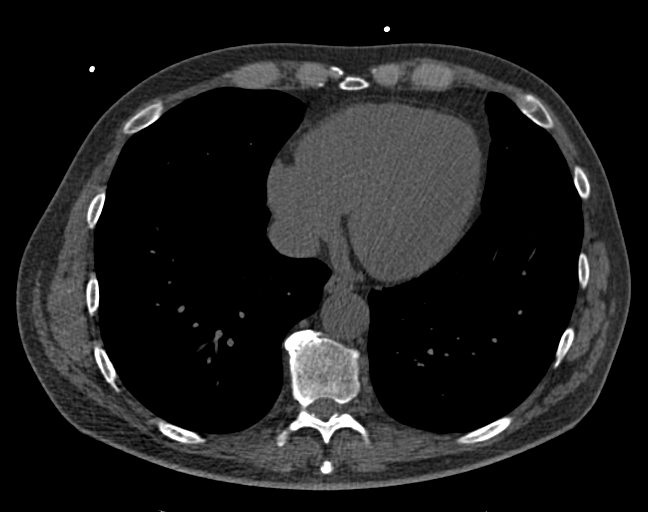
[im 37/74  vessel]
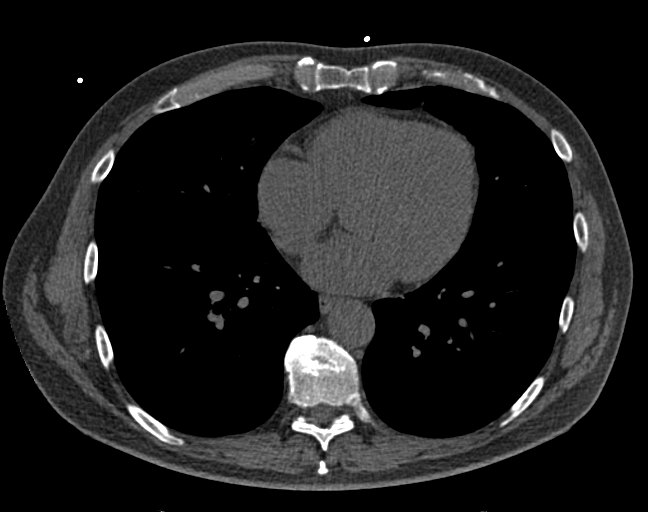
[im 49/74  vessel]
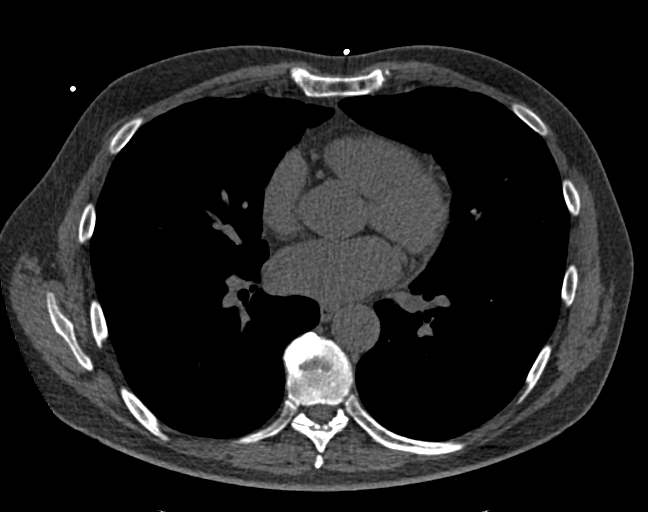
[im 61/74  vessel]
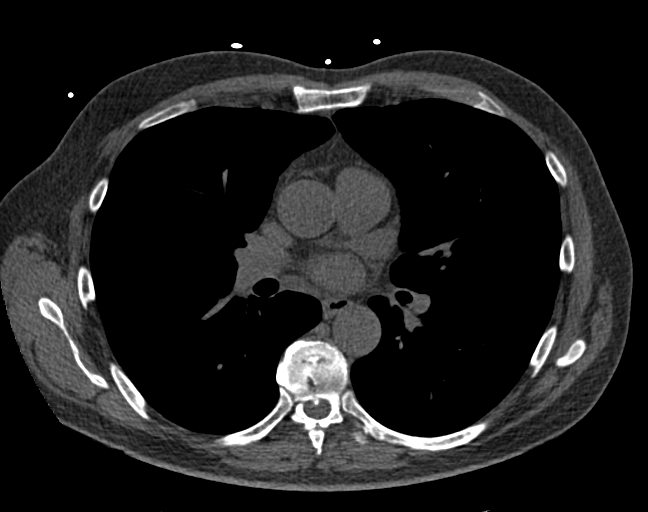
[im 61/74  lung]
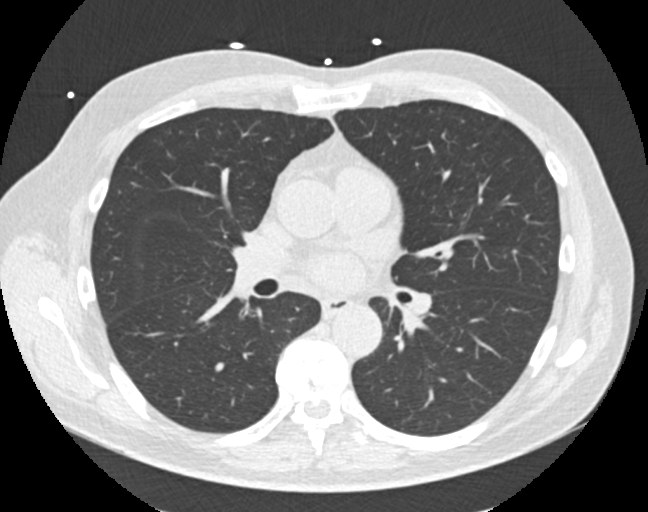

[Series 9: calcium scoring 2.00 br60 bestdiast 70% lungs · axial · 0.59mm/px · z∈[+1504,+1600]mm · 5 of 74 slices shown]
[im 13/74  vessel]
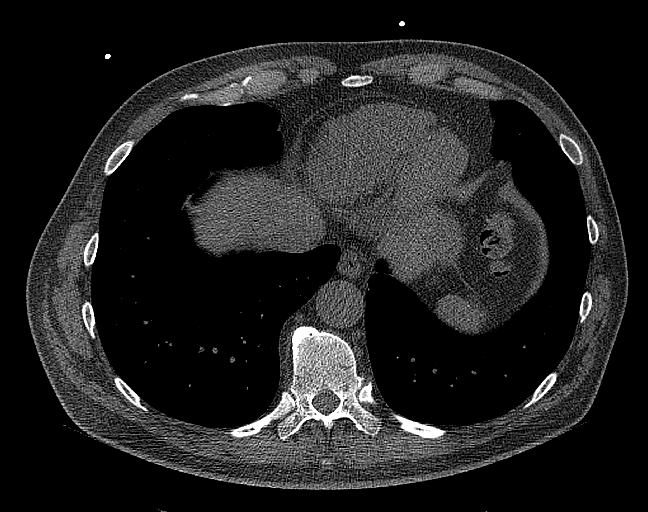
[im 25/74  vessel]
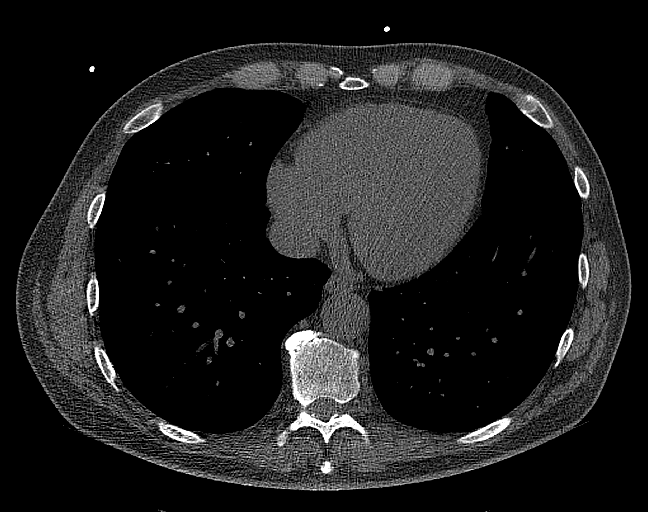
[im 37/74  vessel]
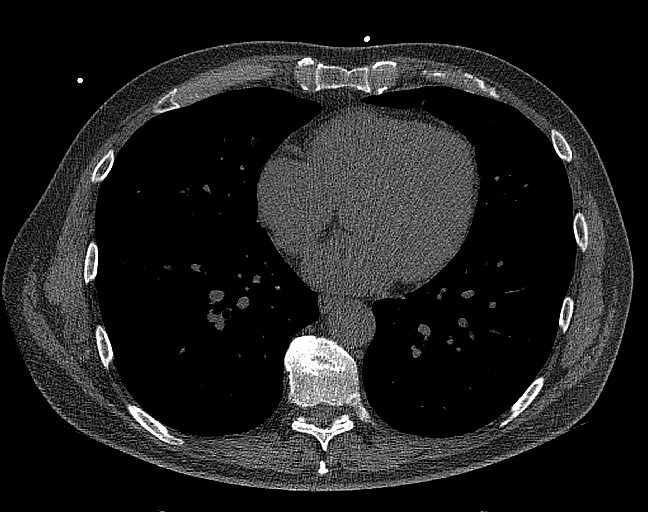
[im 49/74  vessel]
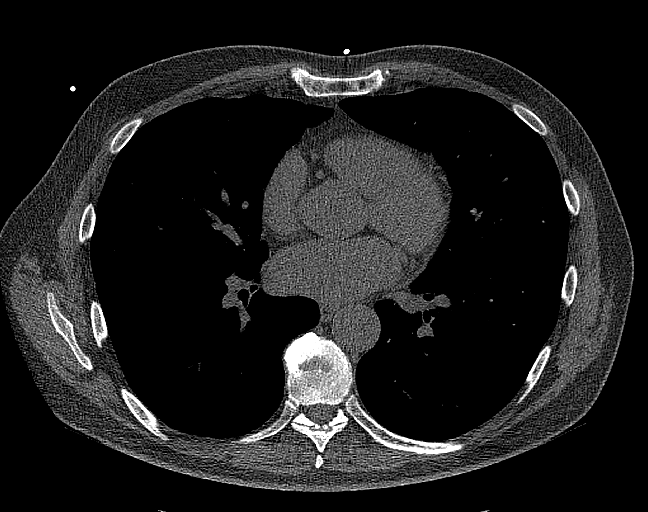
[im 61/74  vessel]
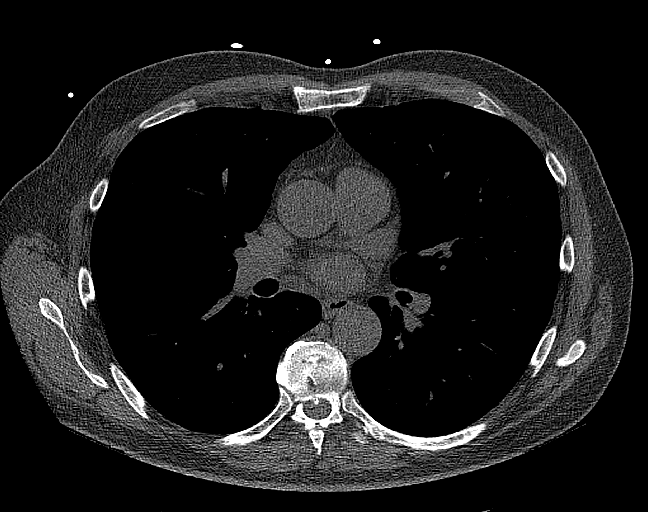

[14 of 20 positions shown; findings below may reference images not displayed]

FINDINGS: CORONARY CALCIUM SCORES:

Left Main: 0

LAD: 0

LCx: 0

RCA: 0

Total Agatston Score: 0

[HOSPITAL] percentile: 0

AORTA MEASUREMENTS:

Ascending Aorta: 33 mm

Descending Aorta: 28 mm

OTHER FINDINGS:

